# Patient Record
Sex: Female | Born: 1935 | Race: White | Hispanic: No | Marital: Married | Smoking: Never smoker
Health system: Southern US, Community
[De-identification: ages and names within clinical notes are randomized; demographics above are authoritative.]

## PROBLEM LIST (undated history)

## (undated) DIAGNOSIS — I2510 Atherosclerotic heart disease of native coronary artery without angina pectoris: Secondary | ICD-10-CM

## (undated) DIAGNOSIS — G4730 Sleep apnea, unspecified: Secondary | ICD-10-CM

## (undated) DIAGNOSIS — I10 Essential (primary) hypertension: Secondary | ICD-10-CM

## (undated) DIAGNOSIS — Q233 Congenital mitral insufficiency: Secondary | ICD-10-CM

## (undated) DIAGNOSIS — K219 Gastro-esophageal reflux disease without esophagitis: Secondary | ICD-10-CM

## (undated) DIAGNOSIS — I509 Heart failure, unspecified: Secondary | ICD-10-CM

## (undated) DIAGNOSIS — I639 Cerebral infarction, unspecified: Secondary | ICD-10-CM

## (undated) DIAGNOSIS — I4891 Unspecified atrial fibrillation: Secondary | ICD-10-CM

## (undated) DIAGNOSIS — J449 Chronic obstructive pulmonary disease, unspecified: Secondary | ICD-10-CM

## (undated) HISTORY — PX: NISSEN FUNDOPLICATION: SHX2091

## (undated) HISTORY — PX: ABDOMINAL HYSTERECTOMY: SHX81

## (undated) HISTORY — DX: Chronic obstructive pulmonary disease, unspecified: J44.9

## (undated) HISTORY — DX: Congenital mitral insufficiency: Q23.3

## (undated) HISTORY — DX: Sleep apnea, unspecified: G47.30

## (undated) HISTORY — PX: REPLACEMENT TOTAL KNEE: SUR1224

## (undated) HISTORY — DX: Cerebral infarction, unspecified: I63.9

## (undated) HISTORY — DX: Atherosclerotic heart disease of native coronary artery without angina pectoris: I25.10

---

## 2013-11-04 ENCOUNTER — Encounter (HOSPITAL_COMMUNITY): Payer: Self-pay | Admitting: Emergency Medicine

## 2013-11-04 ENCOUNTER — Emergency Department (HOSPITAL_COMMUNITY): Payer: Medicare Other

## 2013-11-04 ENCOUNTER — Emergency Department (HOSPITAL_COMMUNITY)
Admission: EM | Admit: 2013-11-04 | Discharge: 2013-11-04 | Disposition: A | Discharge status: Home / Self Care | Payer: Medicare Other | Attending: Emergency Medicine | Admitting: Emergency Medicine

## 2013-11-04 DIAGNOSIS — Z8719 Personal history of other diseases of the digestive system: Secondary | ICD-10-CM | POA: Insufficient documentation

## 2013-11-04 DIAGNOSIS — I509 Heart failure, unspecified: Secondary | ICD-10-CM | POA: Insufficient documentation

## 2013-11-04 DIAGNOSIS — S0990XA Unspecified injury of head, initial encounter: Secondary | ICD-10-CM | POA: Insufficient documentation

## 2013-11-04 DIAGNOSIS — W1809XA Striking against other object with subsequent fall, initial encounter: Secondary | ICD-10-CM | POA: Insufficient documentation

## 2013-11-04 DIAGNOSIS — Y939 Activity, unspecified: Secondary | ICD-10-CM | POA: Insufficient documentation

## 2013-11-04 DIAGNOSIS — IMO0002 Reserved for concepts with insufficient information to code with codable children: Secondary | ICD-10-CM

## 2013-11-04 DIAGNOSIS — Z880 Allergy status to penicillin: Secondary | ICD-10-CM | POA: Insufficient documentation

## 2013-11-04 DIAGNOSIS — I10 Essential (primary) hypertension: Secondary | ICD-10-CM | POA: Insufficient documentation

## 2013-11-04 DIAGNOSIS — S0180XA Unspecified open wound of other part of head, initial encounter: Secondary | ICD-10-CM | POA: Insufficient documentation

## 2013-11-04 DIAGNOSIS — Y92009 Unspecified place in unspecified non-institutional (private) residence as the place of occurrence of the external cause: Secondary | ICD-10-CM | POA: Insufficient documentation

## 2013-11-04 DIAGNOSIS — W19XXXA Unspecified fall, initial encounter: Secondary | ICD-10-CM

## 2013-11-04 HISTORY — DX: Essential (primary) hypertension: I10

## 2013-11-04 HISTORY — DX: Heart failure, unspecified: I50.9

## 2013-11-04 HISTORY — DX: Gastro-esophageal reflux disease without esophagitis: K21.9

## 2013-11-04 HISTORY — DX: Unspecified atrial fibrillation: I48.91

## 2013-11-04 LAB — PROTIME-INR
INR: 2.03 — ABNORMAL HIGH (ref 0.00–1.49)
Prothrombin Time: 22.3 seconds — ABNORMAL HIGH (ref 11.6–15.2)

## 2013-11-04 NOTE — ED Notes (Signed)
Called lab currently running PT INR.

## 2013-11-04 NOTE — ED Notes (Signed)
Patient transported to CT 

## 2013-11-04 NOTE — ED Notes (Signed)
[facility] EDPA at bedside to apply dermabond

## 2013-11-04 NOTE — ED Notes (Signed)
Pt reports being diagnosed with the flu Saturday

## 2013-11-04 NOTE — Discharge Instructions (Signed)
Fall Prevention and Home Safety Falls cause injuries and can affect all age groups. It is possible to prevent falls.  HOW TO PREVENT FALLS  Wear shoes with rubber soles that do not have an opening for your toes.  Keep the inside and outside of your house well lit.  Use night lights throughout your home.  Remove clutter from floors.  Clean up floor spills.  Remove throw rugs or fasten them to the floor with carpet tape.  Do not place electrical cords across pathways.  Put grab bars by your tub, shower, and toilet. Do not use towel bars as grab bars.  Put handrails on both sides of the stairway. Fix loose handrails.  Do not climb on stools or stepladders, if possible.  Do not wax your floors.  Repair uneven or unsafe sidewalks, walkways, or stairs.  Keep items you use a lot within reach.  Be aware of pets.  Keep emergency numbers next to the telephone.  Put smoke detectors in your home and near bedrooms. Ask your doctor what other things you can do to prevent falls. Document Released: 08/09/2009 Document Revised: 04/13/2012 Document Reviewed: 01/13/2012 [facility] Patient Information 2014 [place], [place].  Laceration Care, Adult A laceration is a cut that goes through all layers of the skin. The cut goes into the tissue beneath the skin. HOME CARE For stitches (sutures) or staples:  Keep the cut clean and dry.  If you have a bandage (dressing), change it at least once a day. Change the bandage if it gets wet or dirty, or as told by your doctor.  Wash the cut with soap and water 2 times a day. Rinse the cut with water. Pat it dry with a clean towel.  Put a thin layer of medicated cream on the cut as told by your doctor.  You may shower after the first 24 hours. Do not soak the cut in water until the stitches are removed.  Only take medicines as told by your doctor.  Have your stitches or staples removed as told by your doctor. For skin adhesive strips:  Keep the  cut clean and dry.  Do not get the strips wet. You may take a bath, but be careful to keep the cut dry.  If the cut gets wet, pat it dry with a clean towel.  The strips will fall off on their own. Do not remove the strips that are still stuck to the cut. For wound glue:  You may shower or take baths. Do not soak or scrub the cut. Do not swim. Avoid heavy sweating until the glue falls off on its own. After a shower or bath, pat the cut dry with a clean towel.  Do not put medicine on your cut until the glue falls off.  If you have a bandage, do not put tape over the glue.  Avoid lots of sunlight or tanning lamps until the glue falls off. Put sunscreen on the cut for the first year to reduce your scar.  The glue will fall off on its own. Do not pick at the glue. You may need a tetanus shot if:  You cannot remember when you had your last tetanus shot.  You have never had a tetanus shot. If you need a tetanus shot and you choose not to have one, you may get tetanus. Sickness from tetanus can be serious. GET HELP RIGHT AWAY IF:   Your pain does not get better with medicine.  Your arm, hand, leg,  or foot loses feeling (numbness) or changes color.  Your cut is bleeding.  Your joint feels weak, or you cannot use your joint.  You have painful lumps on your body.  Your cut is red, puffy (swollen), or painful.  You have a red line on the skin near the cut.  You have yellowish-white fluid (pus) coming from the cut.  You have a fever.  You have a bad smell coming from the cut or bandage.  Your cut breaks open before or after stitches are removed.  You notice something coming out of the cut, such as wood or glass.  You cannot move a finger or toe. MAKE SURE YOU:   Understand these instructions.  Will watch your condition.  Will get help right away if you are not doing well or get worse. Document Released: 03/31/2008 Document Revised: 01/05/2012 Document Reviewed:  04/08/2011 [facility] Patient Information 2014 [place], [place].

## 2013-11-04 NOTE — ED Notes (Signed)
PA-C at bedside 

## 2013-11-04 NOTE — ED Notes (Addendum)
Pt coming from home with c/o fall. Pt states she is on Coumadin. Pt presents with a 2 centimeter laceration above right eye, bleeding controlled. Pt denies LOC, n/v. Pt states she slipped out of her chair and her head struck the floor. Pt is A&Ox4, respirations equal and unlabored, skin warm and dry.

## 2013-11-04 NOTE — ED Notes (Signed)
Pt took herself off of oxygen [place] (2L); pt states that she only wears it at night; pt taken to the bathroom via stretcher; pt using bathroom at this time; family in room waiting

## 2013-11-04 NOTE — ED Notes (Signed)
Pt back in room at this time; family at bedside

## 2013-11-04 NOTE — ED Provider Notes (Signed)
CSN: [id number]     Arrival date & time 11/04/13  1478 History   First MD Initiated Contact with Patient 11/04/13 [phone]     Chief Complaint  Patient presents with  . Fall   (Consider location/radiation/quality/duration/timing/severity/associated sxs/prior Treatment) HPI Comments: Patient is an 80 year old female with history of congestive heart failure, atrial fibrillation, hypertension, influenza who presents today after a fall. She reports that she slept in a recliner last night, but that she was in the bed when she woke up. She fell out of the recliner, slid to the floor on her bottom and hit her head. She is only having a mild, burning pain above her right eye where she sustained a laceration. She is on Coumadin. Her last INR was on Tuesday and was 2.7. She has been ambulatory since the fall. She uses a walker to ambulate. She denies nausea, vomiting, abdominal pain. She has shortness of breath, but states nothing new. She denies any chest pain. Her last tetanus shot was in November.  Patient is a 80 y.o. female presenting with fall. The history is provided by the patient. No language interpreter was used.  Fall Pertinent negatives include no abdominal pain, chest pain, chills, fever, headaches, nausea or vomiting.    Past Medical History  Diagnosis Date  . CHF (congestive heart failure)   . A-fib   . Flu   . Hypertension   . GERD (gastroesophageal reflux disease)    Past Surgical History  Procedure Laterality Date  . Abdominal hysterectomy    . Replacement total knee     History reviewed. No pertinent family history. History  Substance Use Topics  . Smoking status: Never Smoker   . Smokeless tobacco: Never Used  . Alcohol Use: No   OB History   Grav Para Term Preterm Abortions TAB SAB Ect Mult Living                 Review of Systems  Constitutional: Negative for fever and chills.  Respiratory: Negative for shortness of breath.   Cardiovascular: Negative for chest pain.   Gastrointestinal: Negative for nausea, vomiting and abdominal pain.  Skin: Positive for wound.  Neurological: Negative for headaches.  All other systems reviewed and are negative.    Allergies  Penicillins  Home Medications  No current outpatient prescriptions on file. BP 142/82  Pulse 97  Temp(Src) 98.4 F (36.9 C) (Oral)  Resp 20  SpO2 95% Physical Exam  Nursing note and vitals reviewed. Constitutional: She is oriented to person, place, and time. She appears well-developed and well-nourished. No distress.  HENT:  Head: Normocephalic.  Right Ear: External ear normal.  Left Ear: External ear normal.  Nose: Nose normal.  Mouth/Throat: Oropharynx is clear and moist.  1.5 cm superficial laceration just superior to right eyebrow. No bleeding.   Eyes: Conjunctivae, EOM and lids are normal. Pupils are equal, round, and reactive to light.  Neck: Normal range of motion. No spinous process tenderness and no muscular tenderness present.  Cardiovascular: Normal rate, regular rhythm, normal heart sounds, intact distal pulses and normal pulses.   Pulmonary/Chest: Effort normal and breath sounds normal. No stridor. No respiratory distress. She has no wheezes. She has no rales.  Abdominal: Soft. She exhibits no distension. There is no tenderness.  Musculoskeletal: Normal range of motion.  Strength 5/5 in all extremities Pelvis stable  Neurological: She is alert and oriented to person, place, and time. She has normal strength. No sensory deficit. Coordination normal.  Grip strength  5/5  Skin: Skin is warm and dry. She is not diaphoretic. No erythema.  Psychiatric: She has a normal mood and affect. Her behavior is normal.    ED Course  Procedures (including critical care time) Labs Review Labs Reviewed  PROTIME-INR - Abnormal; Notable for the following:    Prothrombin Time 22.3 (*)    INR 2.03 (*)    All other components within normal limits   Imaging Review Ct Head Wo  Contrast  11/04/2013   CLINICAL DATA:  Status post fall with laceration above the right eye.  EXAM: CT HEAD WITHOUT CONTRAST  CT CERVICAL SPINE WITHOUT CONTRAST  TECHNIQUE: Multidetector CT imaging of the head and cervical spine was performed following the standard protocol without intravenous contrast. Multiplanar CT image reconstructions of the cervical spine were also generated.  COMPARISON:  None.  FINDINGS: CT HEAD FINDINGS  There is no midline shift, hydrocephalus or mass. No acute hemorrhage or acute transcortical infarct is identified. There is chronic diffuse atrophy. Chronic bilateral periventricular white matter small vessel ischemic change is noted. There are small infarctions, chronic in the bilateral sub insula. The bony calvarium is intact. The visualized sinuses are clear.  CT CERVICAL SPINE FINDINGS  There is no acute fracture or dislocation. The prevertebral soft tissues are normal. There are degenerative joint changes with narrowed joint space and osteophyte formation throughout the cervical spine. The lung apices are clear.  IMPRESSION: No focal acute intracranial abnormality identified. Chronic diffuse atrophy. Chronic bilateral periventricular white matter small vessel ischemic change.  No acute fracture or dislocation of cervical spine.   Electronically Signed   By: Sherian Rein M.D.   On: 11/04/2013 08:19   Ct Cervical Spine Wo Contrast  11/04/2013   CLINICAL DATA:  Status post fall with laceration above the right eye.  EXAM: CT HEAD WITHOUT CONTRAST  CT CERVICAL SPINE WITHOUT CONTRAST  TECHNIQUE: Multidetector CT imaging of the head and cervical spine was performed following the standard protocol without intravenous contrast. Multiplanar CT image reconstructions of the cervical spine were also generated.  COMPARISON:  None.  FINDINGS: CT HEAD FINDINGS  There is no midline shift, hydrocephalus or mass. No acute hemorrhage or acute transcortical infarct is identified. There is chronic  diffuse atrophy. Chronic bilateral periventricular white matter small vessel ischemic change is noted. There are small infarctions, chronic in the bilateral sub insula. The bony calvarium is intact. The visualized sinuses are clear.  CT CERVICAL SPINE FINDINGS  There is no acute fracture or dislocation. The prevertebral soft tissues are normal. There are degenerative joint changes with narrowed joint space and osteophyte formation throughout the cervical spine. The lung apices are clear.  IMPRESSION: No focal acute intracranial abnormality identified. Chronic diffuse atrophy. Chronic bilateral periventricular white matter small vessel ischemic change.  No acute fracture or dislocation of cervical spine.   Electronically Signed   By: Sherian Rein M.D.   On: 11/04/2013 08:19    EKG Interpretation   None      LACERATION REPAIR Performed by: Junious Silk Authorized by: Junious Silk Consent: Verbal consent obtained. Risks and benefits: risks, benefits and alternatives were discussed Consent given by: patient Patient identity confirmed: provided demographic data Prepped and Draped in normal sterile fashion Wound explored  Laceration Location: right forehead  Laceration Length: 1.5 cm  No Foreign Bodies seen or palpated  Anesthesia: local infiltration  Local anesthetic: none  Irrigation method: syringe Amount of cleaning: standard  Skin closure: dermabond  Patient tolerance: Patient tolerated  the procedure well with no immediate complications.   MDM   1. Fall, initial encounter   2. Laceration    Patient is an 80 year old female who presents today after a mechanical fall. Patient is alert and oriented. She is able to give a clear, coherent history. Pt is on Coumadin. INR is 2.03. Head and cervical spine CT are negative for acute pathology. I discussed this case with Dr. Rubin PayorPickering who agrees with plan. Will call cardiologist on Monday for follow up. Return instructions given.  Vital signs stable for discharge. Patient / Family / Caregiver informed of clinical course, understand medical decision-making process, and agree with plan.     Mora BellmanHannah S Nancyann Cotterman, PA-C 11/04/13 1215  Mora BellmanHannah S Rorie Delmore, PA-C 11/04/13 1215

## 2013-11-06 NOTE — ED Provider Notes (Signed)
Medical screening examination/treatment/procedure(s) were performed by non-physician practitioner and as supervising physician I was immediately available for consultation/collaboration.  EKG Interpretation   None        Juliet RudeNathan R. Rubin PayorPickering, MD 11/06/13 [phone]

## 2014-11-14 LAB — PROTIME-INR: INR: 2.9 — AB (ref 0.9–1.1)

## 2014-11-15 LAB — PROTIME-INR: INR: 1.7 — AB (ref 0.9–1.1)

## 2014-12-26 ENCOUNTER — Encounter: Payer: Self-pay | Admitting: Cardiology

## 2014-12-26 ENCOUNTER — Ambulatory Visit (INDEPENDENT_AMBULATORY_CARE_PROVIDER_SITE_OTHER): Payer: Medicare Other | Admitting: Cardiology

## 2014-12-26 VITALS — BP 116/60 | HR 87 | Ht 66.0 in | Wt 188.0 lb

## 2014-12-26 DIAGNOSIS — I2510 Atherosclerotic heart disease of native coronary artery without angina pectoris: Secondary | ICD-10-CM | POA: Insufficient documentation

## 2014-12-26 DIAGNOSIS — I482 Chronic atrial fibrillation, unspecified: Secondary | ICD-10-CM

## 2014-12-26 DIAGNOSIS — E878 Other disorders of electrolyte and fluid balance, not elsewhere classified: Secondary | ICD-10-CM

## 2014-12-26 DIAGNOSIS — I4891 Unspecified atrial fibrillation: Secondary | ICD-10-CM | POA: Insufficient documentation

## 2014-12-26 NOTE — Progress Notes (Signed)
Cardiology Office Note   Date:  12/26/2014   ID:  Sally PootMyrtle White, DOB 02/12/1928, MRN [id number]  PCP:  PROVIDER NOT IN SYSTEM  Cardiologist:   Rollene RotundaJames Domenic Schoenberger, MD   Chief Complaint  Patient presents with  . Congestive Heart Failure      History of Present Illness: Sally White is a 81 y.o. female who presents for new patient evaluation. [place] from [place]. She has a history of coronary disease with stenting in 2005 in her circumflex. 2007 demonstrated this to be patent. She has had a preserved ejection fraction. Her last stress test was in December 2014 with a small fixed defects at the apex. Her last echocardiogram that I have currently was in January of last year with a well preserved ejection fraction. However, she says [facility] and had [facility]. I don't have this result. She actually says she's been hospitalized 4 times this year. [facility] early [facility]. Later on that month she had a nosebleed. The end of the month she was hospitalized with apparent diastolic heart failure. In [place] later on she had a fall. She was seen again in the emergency room for volume overload last weekend. She denies any chest pain. She's not had any neck or arm discomfort. She denies any palpitations, presyncope or syncope. She seems to sleep chronically with her head elevated in a hospital bed. She is weighing herself daily and her weight seemed to be stable. She is not particularly restricting salt. She is limited by joint pain and gets around with a walker. She has had some mild extremity swelling.  Past Medical History  Diagnosis Date  . CHF (congestive heart failure)   . A-fib   . Hypertension   . GERD (gastroesophageal reflux disease)   . COPD (chronic obstructive pulmonary disease)     O2 at night  . Sleep apnea     Past Surgical History  Procedure Laterality Date  . Abdominal  hysterectomy    . Replacement total knee    . Nissen fundoplication       Current Outpatient Prescriptions  Medication Sig Dispense Refill  . albuterol-ipratropium (COMBIVENT) 18-103 MCG/ACT inhaler Inhale 2 puffs into the lungs every 6 (six) hours as needed for wheezing or shortness of breath.    . benzonatate (TESSALON) 100 MG capsule Take 100 mg by mouth 3 (three) times daily as needed for cough.    . chlorthalidone (HYGROTON) 25 MG tablet Take 12.5 mg by mouth daily.    . furosemide (LASIX) 40 MG tablet Take 20-40 mg by mouth daily.    Marland Kitchen. losartan (COZAAR) 50 MG tablet Take 50 mg by mouth daily.    . pravastatin (PRAVACHOL) 10 MG tablet Take 10 mg by mouth daily.    . ranitidine (ZANTAC) 150 MG tablet Take 150 mg by mouth 2 (two) times daily.    Marland Kitchen. warfarin (COUMADIN) 5 MG tablet Take 2.5 mg by mouth daily at 6 PM.     No current facility-administered medications for this visit.    Allergies:   Penicillins    Social History:  The patient  reports that she has never smoked. She has never used smokeless tobacco. She reports that she does not drink alcohol or use illicit drugs.   Family History:  The patient's family history includes Cancer - Colon in her sister; Heart disease in her mother; Liver cancer in her  father; Lung cancer in her brother.    ROS:  Please see the history of present illness.   Otherwise, review of systems are positive for none.   All other systems are reviewed and negative.    PHYSICAL EXAM: VS:  BP 116/60 mmHg  Pulse 87  Ht  (1.676 m)  Wt 188 lb (85.276 kg)  BMI 30.36 kg/m2 , BMI Body mass index is 30.36 kg/(m^2). GENERAL:  Well appearing HEENT:  Pupils equal round and reactive, fundi not visualized, oral mucosa unremarkable NECK:  Positive jugular venous distention at 45 degrees, waveform within normal limits, carotid upstroke brisk and symmetric, no bruits, no thyromegaly LYMPHATICS:  No cervical, inguinal adenopathy LUNGS:  Bilateral basilar  crackles BACK:  No CVA tenderness CHEST:  Unremarkable HEART:  PMI not displaced or sustained,S1 and S2 within normal limits, no S3, no S4, no clicks, no rubs, no murmurs ABD:  Flat, positive bowel sounds normal in frequency in pitch, no bruits, no rebound, no guarding, no midline pulsatile mass, no hepatomegaly, no splenomegaly EXT:  2 plus pulses throughout, mild edema, no cyanosis no clubbing SKIN:  No rashes no nodules NEURO:  Cranial nerves II through XII grossly intact, motor grossly intact throughout PSYCH:  Cognitively intact, oriented to person place and time    EKG:  EKG is ordered today. The ekg ordered today demonstrates atrial fibrillation, rate 87, interventricular conduction delay   Recent Labs: No results found for requested labs within last 365 days.      Wt Readings from Last 3 Encounters:  12/26/14 188 lb (85.276 kg)      Other studies Reviewed: Additional studies/ records that were reviewed today include: Outside records. Review of the above records demonstrates:  Please see elsewhere in the note.    ASSESSMENT AND PLAN:  CHF:  It sounds like she has chronic diastolic dysfunction and I think she's having some acute exacerbation. I had a long discussion with the patient and her daughter about this. We talked about salt restriction, fluid restriction, daily weights and when necessary dosing of Lasix. For now she is going to increase her Lasix to 40 mg twice a day. She's going get a basic metabolic profile today. We'll going to have her establish with the clinic in [place] since she lives up there.  I would like for her to be seen Monday.  ATRIAL FIB:  She will establish with our warfarin clinic. She tolerates rate control and anticoagulation.  CAD:  I do not suspect ischemia. We should review the records from [place] when available. At this point I was not planning another ischemia evaluation.  MR:   Follow up echo results from [facility].     Current medicines are  reviewed at length with the patient today.  The patient has concerns regarding medicines.  The following changes have been made:  See elsewhere  Labs/ tests ordered today include:   Orders Placed This Encounter  Procedures  . Basic metabolic panel  . EKG 12-Lead     Disposition:   FU in [place] as above.     Signed, Rollene Rotunda, MD  12/26/2014 5:54 PM    [facility]

## 2014-12-26 NOTE — Patient Instructions (Signed)
Your physician recommends that you schedule a follow-up appointment in: on Monday in [place] 01/01/2015 with who ever is there  Take furosemide 40 mg two times a day for two days then back to your reg dose  Pt. [facility]

## 2014-12-27 ENCOUNTER — Telehealth: Payer: Self-pay | Admitting: Cardiovascular Disease

## 2014-12-27 ENCOUNTER — Telehealth: Payer: Self-pay | Admitting: Cardiology

## 2014-12-27 NOTE — Telephone Encounter (Signed)
Mosetta Anisasheika is calling because she is needing a signature on orders for lab on Sally White .

## 2014-12-27 NOTE — Telephone Encounter (Signed)
[facility] - needs Dr. Jenene SlickerHochrein's signature. Gave fax number to send to us.

## 2014-12-27 NOTE — Telephone Encounter (Signed)
Form signed & faxed back, confirmed receipt w/ requestor.

## 2014-12-27 NOTE — Telephone Encounter (Signed)
Daughter called stating that Sally White is having episodes of coughing and shortness of breath. Please call # [phone].

## 2014-12-27 NOTE — Telephone Encounter (Signed)
Spoke with daughter and she stated that patient's cough and breathing had improved since she called this morning. Patient denied chest pain, or dizziness. Nurse advised patient that [facility] and if patient's sob and cough came back, to call our office again. Daughter verbalized understanding of plan.

## 2015-01-01 ENCOUNTER — Ambulatory Visit: Payer: Medicare Other | Admitting: Cardiovascular Disease

## 2015-01-02 ENCOUNTER — Telehealth: Payer: Self-pay | Admitting: Cardiology

## 2015-01-02 NOTE — Telephone Encounter (Signed)
Please call,pt says she is having problems. Pt did not give details of her problems.

## 2015-01-02 NOTE — Telephone Encounter (Signed)
I spoke with daughter (on dpr).  She reports that her mother is visiting from [place] and has fallen x2 since Friday.  The symptoms started abruptly on Friday and worsened as the days have gone by.  The patient reports that her feet won't move and there is some pain involved.  She is exhibiting a decrease in coordination as well.  Patient does not exhibit unilateral symptoms or decrease in mental function.  Patient does not have a PCP.  The daughter seems very concerned and is questioning where to take her mom.  I advised to go to the ER for evaluation.  I do not think that this is heart related, but definitely needs to be addressed!  Daughter verbalized understanding.

## 2015-01-08 ENCOUNTER — Telehealth: Payer: Self-pay | Admitting: Cardiology

## 2015-01-08 NOTE — Telephone Encounter (Signed)
Message was not needed at this time.

## 2015-01-09 ENCOUNTER — Encounter: Payer: Self-pay | Admitting: Cardiovascular Disease

## 2015-01-09 ENCOUNTER — Ambulatory Visit (INDEPENDENT_AMBULATORY_CARE_PROVIDER_SITE_OTHER): Payer: Medicare Other | Admitting: Cardiovascular Disease

## 2015-01-09 ENCOUNTER — Ambulatory Visit (INDEPENDENT_AMBULATORY_CARE_PROVIDER_SITE_OTHER): Payer: Medicare Other | Admitting: *Deleted

## 2015-01-09 VITALS — BP 118/78 | HR 77 | Ht 66.0 in | Wt 175.2 lb

## 2015-01-09 DIAGNOSIS — Z5181 Encounter for therapeutic drug level monitoring: Secondary | ICD-10-CM

## 2015-01-09 DIAGNOSIS — I482 Chronic atrial fibrillation, unspecified: Secondary | ICD-10-CM

## 2015-01-09 DIAGNOSIS — W19XXXS Unspecified fall, sequela: Secondary | ICD-10-CM

## 2015-01-09 DIAGNOSIS — I447 Left bundle-branch block, unspecified: Secondary | ICD-10-CM

## 2015-01-09 DIAGNOSIS — E785 Hyperlipidemia, unspecified: Secondary | ICD-10-CM

## 2015-01-09 DIAGNOSIS — I38 Endocarditis, valve unspecified: Secondary | ICD-10-CM

## 2015-01-09 DIAGNOSIS — Z7901 Long term (current) use of anticoagulants: Secondary | ICD-10-CM

## 2015-01-09 DIAGNOSIS — I5033 Acute on chronic diastolic (congestive) heart failure: Secondary | ICD-10-CM

## 2015-01-09 DIAGNOSIS — I480 Paroxysmal atrial fibrillation: Secondary | ICD-10-CM

## 2015-01-09 DIAGNOSIS — I2510 Atherosclerotic heart disease of native coronary artery without angina pectoris: Secondary | ICD-10-CM

## 2015-01-09 DIAGNOSIS — Z9289 Personal history of other medical treatment: Secondary | ICD-10-CM

## 2015-01-09 DIAGNOSIS — Z87898 Personal history of other specified conditions: Secondary | ICD-10-CM

## 2015-01-09 LAB — POCT INR: INR: 3.9

## 2015-01-09 MED ORDER — METOPROLOL SUCCINATE ER 25 MG PO TB24: 25.0000 mg | ORAL_TABLET | Freq: Every day | ORAL | Status: AC

## 2015-01-09 MED ORDER — FUROSEMIDE 40 MG PO TABS
40.0000 mg | ORAL_TABLET | Freq: Every day | ORAL | Status: DC
Start: 2015-01-09 — End: 2015-02-01

## 2015-01-09 MED ORDER — PRAVASTATIN SODIUM 10 MG PO TABS: 10.0000 mg | ORAL_TABLET | Freq: Every day | ORAL | Status: AC

## 2015-01-09 MED ORDER — POTASSIUM CHLORIDE ER 10 MEQ PO TBCR: EXTENDED_RELEASE_TABLET | ORAL | Status: AC

## 2015-01-09 MED ORDER — RANITIDINE HCL 150 MG PO TABS: 150.0000 mg | ORAL_TABLET | Freq: Two times a day (BID) | ORAL | Status: AC

## 2015-01-09 MED ORDER — BENZONATATE 100 MG PO CAPS: 100.0000 mg | ORAL_CAPSULE | Freq: Three times a day (TID) | ORAL | Status: AC | PRN

## 2015-01-09 MED ORDER — SPIRONOLACTONE 25 MG PO TABS
12.5000 mg | ORAL_TABLET | Freq: Every day | ORAL | Status: AC
Start: 2015-01-09 — End: ?

## 2015-01-09 NOTE — Patient Instructions (Addendum)
Your physician recommends that you schedule a follow-up appointment in: [place]   Take Lasix and potassium as we discussed,call if you use extra Lasix for more than 3 consecutive days.     You have been referred to PT     Please get BMET this Friday, 01/12/15  Thank you for choosing [facility] !

## 2015-01-09 NOTE — Progress Notes (Signed)
Patient ID: Sally PootMyrtle White, female   DOB: 05/31/1928, 81 y.o.   MRN: [id number]      SUBJECTIVE: The patient presents for post hospitalization follow-up. This is my first time meeting her. She saw Dr. Antoine White as a new patient on 12/26/14. She moved here from [place], [place], to be with her daughter, Sally White, who has assumed primary care for her this year. [place] live in [place], [place].  She has a history of coronary disease with stenting in 2005 in her circumflex. 2007 demonstrated this to be patent.  Most recent echocardiogram was performed on 02/16/14 which demonstrated normal left ventricular systolic function 55-60%, moderate mitral, tricuspid, and pulmonic regurgitation, mild left atrial enlargement, and mild right ventricular enlargement. The study was technically inadequate to assess diastolic function due to atrial fibrillation. She had a normal nuclear stress test in [place], [place], on 01/10/13.  ECG performed on 12/26/14 demonstrated atrial fibrillation, heart rate 87 bpm, with a nonspecific interventricular conduction delay, QRS duration 138 ms. She was reportedly hospitalized at [facility] multiple times with congestive heart failure.  She uses a walker to ambulate and weighs herself daily. She was recently hospitalized and discharged on 01/02/15 [facility]. Discharge diagnoses included a fall with intractable right hip pain with no acute fracture or dislocation. I personally reviewed all documentation and studies from this hospitalization. Her heart rate was reportedly controlled and INR was therapeutic at 2.9. She had no evidence of congestive heart failure. Pertinent labs include hemoglobin 11.6, platelets 384, sodium 139, potassium 4.3, BUN 22, creatinine 1.07.  ECG performed on 01/01/15 demonstrated atrial fibrillation, heart rate 79 bpm, with a left bundle branch block.  With respect to her fall, it appears that she lost her balance. She has not  received any physical therapy yet. She typically takes Lasix 40 mg daily and this dose has to be increased to 40 mg twice daily roughly every 16 days as per her daughter. The patient denies chest pain and feels that her baseline exertional dyspnea is stable. She denies palpitations. She has been struggling with dizziness and vertigo recently. She denies leg swelling. She appears to consume a lot of sodium as per her daughter. Her daughter has noticed that in the past 24 hours her mother is slightly more congested and short of breath and has been coughing more, and plans to increase Lasix to 40 mg twice daily for the next 2-3 days.  She just established primary care with Dr. Sherril [place] in [place].  Review of Systems: As per "subjective", otherwise negative.  Allergies  Allergen Reactions  . Penicillins Itching and Swelling    Current Outpatient Prescriptions  Medication Sig Dispense Refill  . benzonatate (TESSALON) 100 MG capsule Take 100 mg by mouth 3 (three) times daily as needed for cough.    . cholecalciferol (VITAMIN D) 1000 UNITS tablet Take 1,000 Units by mouth daily.    . furosemide (LASIX) 40 MG tablet Take 20-40 mg by mouth daily.    . Magnesium Hydroxide (MILK OF MAGNESIA PO) Take by mouth.    . metoprolol succinate (TOPROL-XL) 25 MG 24 hr tablet Take 25 mg by mouth daily.    . potassium chloride (K-DUR) 10 MEQ tablet Take 10 mEq by mouth daily.    . pravastatin (PRAVACHOL) 10 MG tablet Take 10 mg by mouth daily.    . ranitidine (ZANTAC) 150 MG tablet Take 150 mg by mouth 2 (two) times daily.    Marland Kitchen. spironolactone (ALDACTONE) 25 MG tablet  Take 25 mg by mouth daily.    Marland Kitchen warfarin (COUMADIN) 5 MG tablet Take 3 mg by mouth daily at 6 PM.      No current facility-administered medications for this visit.    Past Medical History  Diagnosis Date  . CHF (congestive heart failure)     Preserved EF  . A-fib   . Hypertension   . GERD (gastroesophageal reflux disease)   . COPD (chronic  obstructive pulmonary disease)     O2 at night  . Sleep apnea   . CAD (coronary artery disease)     Stent to circ 2005.    . MR (congenital mitral regurgitation)     Moderate    Past Surgical History  Procedure Laterality Date  . Abdominal hysterectomy    . Replacement total knee    . Nissen fundoplication      History   Social History  . Marital Status: Married    Spouse Name: N/A  . Number of Children: 3  . Years of Education: N/A   Occupational History  . Not on file.   Social History Main Topics  . Smoking status: Never Smoker   . Smokeless tobacco: Never Used  . Alcohol Use: No  . Drug Use: No  . Sexual Activity: No   Other Topics Concern  . Not on file   Social History Narrative   Lives with daughter.       Filed Vitals:   01/09/15 0903  BP: 118/78  Pulse: 77  Height:  (1.676 m)  Weight: 175 lb 3.2 oz (79.47 kg)  SpO2: 99%    PHYSICAL EXAM General: NAD, elderly, sitting in wheelchair. HEENT: Normal. Neck: No JVD, no thyromegaly. Lungs: Bibasilar rales up to 1/4 up with normal respiratory effort. CV: Nondisplaced PMI.  Irregular rhythm, normal rate, normal S1/S2, no S3, no murmur. No pretibial or periankle edema.    Abdomen: Soft, nontender, no distention.  Neurologic: Alert and oriented x 3.  Psych: Normal affect. Skin: Normal. Musculoskeletal: No gross deformities. Extremities: No clubbing or cyanosis.   ECG: Most recent ECG reviewed.      ASSESSMENT AND PLAN: 1. CAD: Stable ischemic heart disease. On metoprolol succinate 25 mg daily and pravastatin 10 mg daily. No ASA as no warfarin. 2. Acute on chronic diastolic heart failure: Appears to have some mild decompensation. Given her daughter's close involvement with her care, I feel it is permissible to increase her Lasix to 40 mg twice daily for 2 or 3 days until her mother's congestion resolves and then resume 40 mg daily. She has been instructed to take 20 mEq of supplemental  potassium chloride on the days her Lasix is increased. I had a lengthy discussion with her about the importance of a low sodium diet. She had been weighed daily at home until her fall and now this has become difficult for her daughter. Will obtain a BMET on 3/18. Will give 90-day supplies of all meds with refills. 3. Atrial fibrillation: I will enroll her in our anticoagulation clinic for INR monitoring. HR controlled on Toprol-XL 25 mg daily. INR supratherapeutic at 3.8 today. Will have her meet with our anticoagulation nurse today for warfarin adjustment. 4. Valvular heart disease: Most recent echo results noted above. Symptomatically stable. Will monitor clinically. 5. Recent fall with hip injury: Will order home physical therapy.  Dispo: f/u 6 weeks in [place] office.  Time spent: 40 minutes, of which greater than 50% was spent reviewing symptoms, relevant blood  tests and studies, and discussing management plan with the patient.  Kate Sable, M.D., F.A.C.C.

## 2015-01-10 ENCOUNTER — Telehealth: Payer: Self-pay | Admitting: Cardiovascular Disease

## 2015-01-10 NOTE — Telephone Encounter (Signed)
Would like to speak with nurse regarding medications / tg

## 2015-01-16 ENCOUNTER — Ambulatory Visit (INDEPENDENT_AMBULATORY_CARE_PROVIDER_SITE_OTHER): Payer: Medicare Other | Admitting: *Deleted

## 2015-01-16 DIAGNOSIS — I480 Paroxysmal atrial fibrillation: Secondary | ICD-10-CM | POA: Diagnosis not present

## 2015-01-16 DIAGNOSIS — Z5181 Encounter for therapeutic drug level monitoring: Secondary | ICD-10-CM | POA: Diagnosis not present

## 2015-01-16 LAB — POCT INR: INR: 2.7

## 2015-01-17 ENCOUNTER — Telehealth: Payer: Self-pay | Admitting: *Deleted

## 2015-01-17 NOTE — Telephone Encounter (Signed)
Patient already has follow up scheduled for 02/20/15 here in [place] office.

## 2015-01-17 NOTE — Telephone Encounter (Signed)
Notes Recorded by Lesle ChrisAngela G Toretto Tingler, LPN on 1/61/09603/23/2016 at 2:24 PM Daughter Haskell Flirt(Dorenda) notified.

## 2015-01-17 NOTE — Telephone Encounter (Signed)
-----   Message from Laqueta LindenSuresh A Koneswaran, MD sent at 01/17/2015  1:40 PM EDT ----- Stable.

## 2015-01-23 ENCOUNTER — Ambulatory Visit: Payer: Medicare Other | Admitting: Cardiovascular Disease

## 2015-01-24 ENCOUNTER — Ambulatory Visit (INDEPENDENT_AMBULATORY_CARE_PROVIDER_SITE_OTHER): Payer: Medicare Other | Admitting: *Deleted

## 2015-01-24 DIAGNOSIS — I480 Paroxysmal atrial fibrillation: Secondary | ICD-10-CM

## 2015-01-24 DIAGNOSIS — Z5181 Encounter for therapeutic drug level monitoring: Secondary | ICD-10-CM | POA: Diagnosis not present

## 2015-01-24 LAB — POCT INR: INR: 2.5

## 2015-01-26 ENCOUNTER — Ambulatory Visit (INDEPENDENT_AMBULATORY_CARE_PROVIDER_SITE_OTHER): Payer: Medicare Other | Admitting: Neurology

## 2015-01-26 ENCOUNTER — Emergency Department (HOSPITAL_COMMUNITY): Payer: Medicare Other

## 2015-01-26 ENCOUNTER — Encounter: Payer: Self-pay | Admitting: Neurology

## 2015-01-26 ENCOUNTER — Encounter (HOSPITAL_COMMUNITY): Payer: Self-pay

## 2015-01-26 ENCOUNTER — Other Ambulatory Visit: Payer: Self-pay

## 2015-01-26 ENCOUNTER — Emergency Department (HOSPITAL_COMMUNITY)
Admission: EM | Admit: 2015-01-26 | Discharge: 2015-01-26 | Disposition: A | Discharge status: Home / Self Care | Payer: Medicare Other | Attending: Emergency Medicine | Admitting: Emergency Medicine

## 2015-01-26 VITALS — BP 117/77 | HR 77 | Resp 24 | Ht 66.0 in | Wt 177.0 lb

## 2015-01-26 DIAGNOSIS — Y9389 Activity, other specified: Secondary | ICD-10-CM | POA: Insufficient documentation

## 2015-01-26 DIAGNOSIS — Z79899 Other long term (current) drug therapy: Secondary | ICD-10-CM | POA: Diagnosis not present

## 2015-01-26 DIAGNOSIS — Y9289 Other specified places as the place of occurrence of the external cause: Secondary | ICD-10-CM | POA: Insufficient documentation

## 2015-01-26 DIAGNOSIS — R262 Difficulty in walking, not elsewhere classified: Secondary | ICD-10-CM | POA: Insufficient documentation

## 2015-01-26 DIAGNOSIS — M6281 Muscle weakness (generalized): Secondary | ICD-10-CM | POA: Diagnosis not present

## 2015-01-26 DIAGNOSIS — Z8673 Personal history of transient ischemic attack (TIA), and cerebral infarction without residual deficits: Secondary | ICD-10-CM | POA: Diagnosis not present

## 2015-01-26 DIAGNOSIS — I6340 Cerebral infarction due to embolism of unspecified cerebral artery: Secondary | ICD-10-CM

## 2015-01-26 DIAGNOSIS — I2510 Atherosclerotic heart disease of native coronary artery without angina pectoris: Secondary | ICD-10-CM | POA: Diagnosis not present

## 2015-01-26 DIAGNOSIS — H5509 Other forms of nystagmus: Secondary | ICD-10-CM | POA: Insufficient documentation

## 2015-01-26 DIAGNOSIS — I509 Heart failure, unspecified: Secondary | ICD-10-CM | POA: Diagnosis not present

## 2015-01-26 DIAGNOSIS — I4891 Unspecified atrial fibrillation: Secondary | ICD-10-CM | POA: Diagnosis not present

## 2015-01-26 DIAGNOSIS — Z7901 Long term (current) use of anticoagulants: Secondary | ICD-10-CM | POA: Diagnosis not present

## 2015-01-26 DIAGNOSIS — R269 Unspecified abnormalities of gait and mobility: Secondary | ICD-10-CM | POA: Insufficient documentation

## 2015-01-26 DIAGNOSIS — Z043 Encounter for examination and observation following other accident: Secondary | ICD-10-CM | POA: Insufficient documentation

## 2015-01-26 DIAGNOSIS — W19XXXA Unspecified fall, initial encounter: Secondary | ICD-10-CM

## 2015-01-26 DIAGNOSIS — Z9861 Coronary angioplasty status: Secondary | ICD-10-CM | POA: Insufficient documentation

## 2015-01-26 DIAGNOSIS — Y998 Other external cause status: Secondary | ICD-10-CM | POA: Diagnosis not present

## 2015-01-26 DIAGNOSIS — R531 Weakness: Secondary | ICD-10-CM

## 2015-01-26 DIAGNOSIS — W1839XA Other fall on same level, initial encounter: Secondary | ICD-10-CM | POA: Insufficient documentation

## 2015-01-26 DIAGNOSIS — Q233 Congenital mitral insufficiency: Secondary | ICD-10-CM | POA: Diagnosis not present

## 2015-01-26 DIAGNOSIS — Z9981 Dependence on supplemental oxygen: Secondary | ICD-10-CM | POA: Insufficient documentation

## 2015-01-26 DIAGNOSIS — I749 Embolism and thrombosis of unspecified artery: Secondary | ICD-10-CM

## 2015-01-26 DIAGNOSIS — IMO0002 Reserved for concepts with insufficient information to code with codable children: Secondary | ICD-10-CM | POA: Insufficient documentation

## 2015-01-26 DIAGNOSIS — Z880 Allergy status to penicillin: Secondary | ICD-10-CM | POA: Insufficient documentation

## 2015-01-26 DIAGNOSIS — J449 Chronic obstructive pulmonary disease, unspecified: Secondary | ICD-10-CM | POA: Diagnosis not present

## 2015-01-26 DIAGNOSIS — G4730 Sleep apnea, unspecified: Secondary | ICD-10-CM | POA: Diagnosis not present

## 2015-01-26 DIAGNOSIS — I10 Essential (primary) hypertension: Secondary | ICD-10-CM | POA: Diagnosis not present

## 2015-01-26 DIAGNOSIS — K219 Gastro-esophageal reflux disease without esophagitis: Secondary | ICD-10-CM | POA: Diagnosis not present

## 2015-01-26 DIAGNOSIS — H8190 Unspecified disorder of vestibular function, unspecified ear: Secondary | ICD-10-CM

## 2015-01-26 DIAGNOSIS — R2689 Other abnormalities of gait and mobility: Secondary | ICD-10-CM

## 2015-01-26 LAB — URINALYSIS, ROUTINE W REFLEX MICROSCOPIC
Bilirubin Urine: NEGATIVE
Glucose, UA: NEGATIVE mg/dL
HGB URINE DIPSTICK: NEGATIVE
Ketones, ur: NEGATIVE mg/dL
LEUKOCYTES UA: NEGATIVE
Nitrite: NEGATIVE
Protein, ur: NEGATIVE mg/dL
Specific Gravity, Urine: 1.021 (ref 1.005–1.030)
Urobilinogen, UA: 1 mg/dL (ref 0.0–1.0)
pH: 6.5 (ref 5.0–8.0)

## 2015-01-26 LAB — CBC
HCT: 43.9 % (ref 36.0–46.0)
Hemoglobin: 13.8 g/dL (ref 12.0–15.0)
MCH: 26.2 pg (ref 26.0–34.0)
MCHC: 31.4 g/dL (ref 30.0–36.0)
MCV: 83.5 fL (ref 78.0–100.0)
Platelets: 252 10*3/uL (ref 150–400)
RBC: 5.26 MIL/uL — AB (ref 3.87–5.11)
RDW: 17 % — ABNORMAL HIGH (ref 11.5–15.5)
WBC: 8.1 10*3/uL (ref 4.0–10.5)

## 2015-01-26 LAB — I-STAT TROPONIN, ED: Troponin i, poc: 0 ng/mL (ref 0.00–0.08)

## 2015-01-26 LAB — BASIC METABOLIC PANEL
Anion gap: 9 (ref 5–15)
BUN: 20 mg/dL (ref 6–23)
CALCIUM: 9.8 mg/dL (ref 8.4–10.5)
CHLORIDE: 108 mmol/L (ref 96–112)
CO2: 24 mmol/L (ref 19–32)
Creatinine, Ser: 1.19 mg/dL — ABNORMAL HIGH (ref 0.50–1.10)
GFR, EST AFRICAN AMERICAN: 47 mL/min — AB (ref >90)
GFR, EST NON AFRICAN AMERICAN: 41 mL/min — AB (ref >90)
GLUCOSE: 97 mg/dL (ref 70–99)
POTASSIUM: 4.9 mmol/L (ref 3.5–5.1)
SODIUM: 141 mmol/L (ref 135–145)

## 2015-01-26 NOTE — ED Notes (Signed)
Pt has been unable to walk since second fall per daughter, states that her balance is off since fall. -per RN Amil AmenJulia

## 2015-01-26 NOTE — Patient Instructions (Signed)
Atrial Fibrillation  Atrial fibrillation is a condition that causes your heart to beat irregularly. It may also cause your heart to beat faster than normal. Atrial fibrillation can prevent your heart from pumping blood normally. It increases your risk of stroke and heart problems.  HOME CARE  · Take medications as told by your doctor.  · Only take medications that your doctor says are safe. Some medications can make the condition worse or happen again.  · If blood thinners were prescribed by your doctor, take them exactly as told. Too much can cause bleeding. Too little and you will not have the needed protection against stroke and other problems.  · Perform blood tests at home if told by your doctor.  · Perform blood tests exactly as told by your doctor.  · Do not drink alcohol.  · Do not drink beverages with caffeine such as coffee, soda, and some teas.  · Maintain a healthy weight.  · Do not use diet pills unless your doctor says they are safe. They may make heart problems worse.  · Follow diet instructions as told by your doctor.  · Exercise regularly as told by your doctor.  · Keep all follow-up appointments.  GET HELP IF:  · You notice a change in the speed, rhythm, or strength of your heartbeat.  · You suddenly begin peeing (urinating) more often.  · You get tired more easily when moving or exercising.  GET HELP RIGHT AWAY IF:   · You have chest or belly (abdominal) pain.  · You feel sick to your stomach (nauseous).  · You are short of breath.  · You suddenly have swollen feet and ankles.  · You feel dizzy.  · You face, arms, or legs feel numb or weak.  · There is a change in your vision or speech.  MAKE SURE YOU:   · Understand these instructions.  · Will watch your condition.  · Will get help right away if you are not doing well or get worse.  Document Released: 07/22/2008 Document Revised: 02/27/2014 Document Reviewed: 11/23/2012  ExitCare® Patient Information ©2015 [employer]. This information is not  intended to replace advice given to you by your health care provider. Make sure you discuss any questions you have with your health care provider.

## 2015-01-26 NOTE — ED Notes (Signed)
Daughter reports pt fell on 2/16: fell onto L knee and then onto face with nose fracture reported; on 3/4, pt fell again onto R hip-c/o R leg pain with xrays negative for R hip/leg.

## 2015-01-26 NOTE — Progress Notes (Signed)
Provider:  Melvyn Novas, M D  Referring Provider: No ref. provider found Primary Care Physician:  Ignatius Specking., MD  Chief Complaint  Patient presents with  . NP [facility]  ? Stroke    Rm 10, Daughter and son in law    HPI:  Sally White is a 81 y.o. female  is seen here as an unannounced  referral  from Dr. Andrey Campanile , ENT in [place],  Patient's history relevant for today is a pre-existing condition of atrial fibrillation and chronic anticoagulation. On February 16 the patient apparently fell in her private home [place] and broke her nose. On March 4th she has had another fall, complaining this time of  Dizziness, since than ongoing  difficulties with balance and was unable to walk since.  Neurologically of increased interest is that the patient has developed a different speech pattern. She appears to speak very slowly her voice is soft and of lower volumes than usual. Her daughter describes that she has replaced words so she used for example the word 'sponge "  and later  "blanket " to describe the  Flag. The patient is aware that she uses the wrong terms  but she has obviously difficulty to find the correct word. She leans to the left, she is unable to move her feet, she reports.  The patient did not see a PCP before ENT, and was directly brought by family to our office this morning.   The patient's daughter has noticed that her mother is no longer able to repeat also she used to be able to read without any corrective lens. She has no history for cataract. I was unable to identify a macular in her ophthalmoscopic  evaluation.  Review of Systems: Out of a complete 14 system review, the patient complains of only the following symptoms, and all other reviewed systems are negative. See above ,   Speech changes, word finding difficulties, paraphrasing errors. Gait difficulties with obvious left sided weakness. Tachycardia the COPD, the patient reports vertigo or spinning  sensation dizziness constipation and she has lost interest in several activities that used to give her pleasure.i  History   Social History  . Marital Status: Married    Spouse Name: N/A  . Number of Children: 3  . Years of Education: 9th grade   Occupational History  . retired    Social History Main Topics  . Smoking status: Never Smoker   . Smokeless tobacco: Never Used  . Alcohol Use: No  . Drug Use: No  . Sexual Activity: No   Other Topics Concern  . Not on file   Social History Narrative   Lives with daughter.     Caffeine none.    Family History  Problem Relation Age of Onset  . Liver cancer Father   . Heart disease Mother     No details  . Lung cancer Brother   . Cancer - Colon Sister     Past Medical History  Diagnosis Date  . CHF (congestive heart failure)     Preserved EF  . A-fib   . Hypertension   . GERD (gastroesophageal reflux disease)   . COPD (chronic obstructive pulmonary disease)     O2 at night  . Sleep apnea   . CAD (coronary artery disease)     Stent to circ 2005.    . MR (congenital mitral regurgitation)     Moderate  . Stroke     Past  Surgical History  Procedure Laterality Date  . Abdominal hysterectomy    . Replacement total knee    . Nissen fundoplication      Current Outpatient Prescriptions  Medication Sig Dispense Refill  . benzonatate (TESSALON) 100 MG capsule Take 1 capsule (100 mg total) by mouth 3 (three) times daily as needed for cough. 20 capsule 11  . cholecalciferol (VITAMIN D) 1000 UNITS tablet Take 1,000 Units by mouth daily.    . furosemide (LASIX) 40 MG tablet Take 1 tablet (40 mg total) by mouth daily. Take 40 mg daily, may take an additional dose daily for edema 135 tablet 3  . Magnesium Hydroxide (MILK OF MAGNESIA PO) Take by mouth.    . metoprolol succinate (TOPROL-XL) 25 MG 24 hr tablet Take 1 tablet (25 mg total) by mouth at bedtime. 90 tablet 3  . potassium chloride (K-DUR) 10 MEQ tablet Take 10 meq  daily,may take etra dose on days lasix is increased 135 tablet 3  . pravastatin (PRAVACHOL) 10 MG tablet Take 1 tablet (10 mg total) by mouth at bedtime. 90 tablet 3  . ranitidine (ZANTAC) 150 MG tablet Take 1 tablet (150 mg total) by mouth 2 (two) times daily with a meal. 180 tablet 3  . spironolactone (ALDACTONE) 25 MG tablet Take 0.5 tablets (12.5 mg total) by mouth daily. 45 tablet 3  . warfarin (COUMADIN) 5 MG tablet Take 5 mg by mouth daily at 6 PM. Take 1/2 tablet daily     No current facility-administered medications for this visit.    Allergies as of 01/26/2015 - Review Complete 01/26/2015  Allergen Reaction Noted  . Penicillins Itching and Swelling 11/04/2013    Vitals: BP 117/77 mmHg  Pulse 77  Resp 24  Ht 5\' 6"  (1.676 m)  Wt 177 lb (80.287 kg)  BMI 28.58 kg/m2 Last Weight:  Wt Readings from Last 1 Encounters:  01/26/15 177 lb (80.287 kg)   Last Height:   Ht Readings from Last 1 Encounters:  01/26/15 5\' 6"  (1.676 m)    Physical exam:  General: The patient is awake, alert and appears not in acute distress. The patient is well groomed. Head: Normocephalic, atraumatic. Neck is supple. Mallampati 2, neck circumference: 14.5. Cardiovascular:  irregular rate and rhythm, without  murmurs or carotid bruit, and without distended neck veins. Respiratory: Lungs are clear to auscultation. Skin:  Without evidence of edema, or rash Trunk: BMI is  elevated . Neurologic exam : The patient is awake and alert, oriented to place and time.  Memory subjective   described as intact.  There is a normal attention span & concentration ability. Speech is non fluent with all 3 components affected- dysarthria, dysphonia or aphasia. Mood and affect are appropriate.  Cranial nerves: Pupils are equal and briskly reactive to light. Funduscopic exam without  ; red , rosy retina, but unable to identify any vessels, or the macula. . Extraocular movements  in vertical  plane intact ,  but there is  nystagmus with horizontal eye movements to the left and right. This nystagmus does not fatigue. Nausea during exam.  Visual fields by finger perimetry are intact. Hearing to finger rub intact.  Facial sensation intact to fine touch. Facial motor strength is symmetric and tongue and uvula move midline.  Tongue protrusion into either cheek is normal. Shoulder shrug is weaker on the left    Motor exam:   Weaker general tone , left tone was stronger, waxy, ,muscle bulk was symmetric in all extremities.  Sensory:  Fine touch, pinprick and vibration were tested in all extremities. Proprioception was normal.  Coordination: Rapid alternating movements in the fingers/hands were normal.  Finger-to-nose maneuver  normal without evidence of ataxia, dysmetria or tremor. There was a mild pronator drift on the left.   Gait and station: Patient presents today in a wheelchair. According to her family she is able with assistance on both sides to walk she is however very unsteady and seems to drift backwards and forwards. She will place her right foot first apparently being able on the right to carry her body weight. The left will follow and is everted during her gait exam .  Ascend only with 2 persons assisting  Onto the exam table. The patient was able to sit without further assistance but during the eye movement exam he came dizzy and she drifts when dizzy towards the left.  Deep tendon reflexes: in the  upper and lower extremities are symmetric and intact. Babinski maneuver response is upgoing.   Assessment:  After physical and neurologic examination, review of laboratory studies, imaging, neurophysiology testing and pre-existing records, assessment is that of :  Mrs. Corton seems to have suffered a rather severe fall already in February it latched to her nose being fractured and she had several days of epistaxis due to her anticoagulated state. I'm concerned that she suffered a contusion or concussion with in  that. She may also have a frontal lobe damage from the fall as a horizontal smooth eye movements are affected. She has been unable to repeat since about that time. In addition she may have a traumatic vertigo. In March she fell again this time complaining of dizziness as the cause of the fall. Her note horizontal nystagmus would indicate that she could indeed have a posttraumatic vertigo. That her speech and her balance and her eye movements have changed is usually related to a cerebellar injury and this could have been a stroke the patient is at a higher stroke risk even when anticoagulated due to paroxysmal atrial fibrillation. Would have had embolic strokes in various regions occurring from this. In February she had a CT scan at the local [place] emergency room. And she suffered her second fall while visiting her daughter at her [place] home. I will order an MRI of the brain without contrast. If this MRI scan is unremarkable I would follow with transcranial Doppler studies and an emboli monitoring study.  She remains for now on anticoagulation and will need rehab , ST and PT .     Plan:  Treatment plan and additional workup : see PT and ST . April 7 th [place] , [facility]. I have discussed with Mrs. Cotterill family and the patient presents that I think it would be best to have the patient admitted.  We do have a certified stroke Center here in the [place] location and I feel that she will get her testing and workup in a faster way, I will contact the stroke physician on duty. If this cannot be done for any reason we will persist pursue the outpatient way and use the [facility] location as the fall back option. It is easier for the patient and family to reach [facility] in [place] for any outpatient care, therapies, diagnostic tests. [place] copy to Dr. Andrey Campanile ENT and to the patient's primary care physician, Dr. Sherril Croon.      Porfirio Mylar Md Smola  MD 01/26/2015

## 2015-01-26 NOTE — Progress Notes (Signed)
ED CM consulted concerning patient. Patient presented to [facility] ED s/p fall as per daughter. ED evaluation revealed no significant findings, ED CM met with patient and family at bedside, discussed recommendation for [facility] services, daughter reveals that patient has been receiving [facility] services. She reports that iit is very difficult on the family delivering care 24 hours/7 days per week. The family is interested in SNF Explained the 3 night qualifying stay criteria for SNF placement covered by Medicare family verbalized understanding. Patient is currently receiving OP PT for reconditioning as per daughter. Explained that patient does not currently meet criteria for inpatient admission, patient will be discharged to f/u with PCP, patient and family agreeable with discharge plan. No further ED CM needs identified.

## 2015-01-26 NOTE — ED Provider Notes (Signed)
CSN: [id number]     Arrival date & time 01/26/15  1054 History   First MD Initiated Contact with Patient 01/26/15 1121     Chief Complaint  Patient presents with  . Fall     (Consider location/radiation/quality/duration/timing/severity/associated sxs/prior Treatment) HPI Comments: Sent in by [facility] as patient has concern for stroke. Has fallen twice in the past month, with marked declines after each fall. She has word-finding difficulties and ambulatory difficulties, which are new. She's also having some L sided weakness which is new. Hx of stroke previously. She's on coumadin for Afib.  Patient is a 81 y.o. female presenting with fall. The history is provided by the patient.  Fall This is a recurrent problem. The current episode started more than 1 week ago. Episode frequency: every few weeks. The problem has not changed since onset.Pertinent negatives include no chest pain, no abdominal pain and no shortness of breath. Nothing aggravates the symptoms. Nothing relieves the symptoms.    Past Medical History  Diagnosis Date  . CHF (congestive heart failure)     Preserved EF  . A-fib   . Hypertension   . GERD (gastroesophageal reflux disease)   . COPD (chronic obstructive pulmonary disease)     O2 at night  . Sleep apnea   . CAD (coronary artery disease)     Stent to circ 2005.    . MR (congenital mitral regurgitation)     Moderate  . Stroke    Past Surgical History  Procedure Laterality Date  . Abdominal hysterectomy    . Replacement total knee    . Nissen fundoplication     Family History  Problem Relation Age of Onset  . Liver cancer Father   . Heart disease Mother     No details  . Lung cancer Brother   . Cancer - Colon Sister    History  Substance Use Topics  . Smoking status: Never Smoker   . Smokeless tobacco: Never Used  . Alcohol Use: No   OB History    No data available     Review of Systems  Constitutional: Negative for fever.   Respiratory: Negative for cough and shortness of breath.   Cardiovascular: Negative for chest pain.  Gastrointestinal: Negative for abdominal pain.  All other systems reviewed and are negative.     Allergies  Penicillins  Home Medications   Prior to Admission medications   Medication Sig Start Date End Date Taking? Authorizing Provider  benzonatate (TESSALON) 100 MG capsule Take 1 capsule (100 mg total) by mouth 3 (three) times daily as needed for cough. 01/09/15   Laqueta Linden, MD  cholecalciferol (VITAMIN D) 1000 UNITS tablet Take 1,000 Units by mouth daily.    Historical Provider, MD  furosemide (LASIX) 40 MG tablet Take 1 tablet (40 mg total) by mouth daily. Take 40 mg daily, may take an additional dose daily for edema 01/09/15   Laqueta Linden, MD  Magnesium Hydroxide (MILK OF MAGNESIA PO) Take by mouth.    Historical Provider, MD  metoprolol succinate (TOPROL-XL) 25 MG 24 hr tablet Take 1 tablet (25 mg total) by mouth at bedtime. Patient not taking: Reported on 01/26/2015 01/09/15   Laqueta Linden, MD  potassium chloride (K-DUR) 10 MEQ tablet Take 10 meq daily,may take etra dose on days lasix is increased 01/09/15   Laqueta Linden, MD  pravastatin (PRAVACHOL) 10 MG tablet Take 1 tablet (10 mg total) by mouth at bedtime. 01/09/15  Laqueta Linden, MD  ranitidine (ZANTAC) 150 MG tablet Take 1 tablet (150 mg total) by mouth 2 (two) times daily with a meal. 01/09/15   Laqueta Linden, MD  spironolactone (ALDACTONE) 25 MG tablet Take 0.5 tablets (12.5 mg total) by mouth daily. 01/09/15   Laqueta Linden, MD  warfarin (COUMADIN) 5 MG tablet Take 5 mg by mouth daily at 6 PM. Take 1/2 tablet daily    Historical Provider, MD   BP 118/69 mmHg  Pulse 83  Temp(Src) 97.3 F (36.3 C) (Oral)  Resp 24  Ht  (1.676 m)  Wt 177 lb (80.287 kg)  BMI 28.58 kg/m2  SpO2 97% Physical Exam  Constitutional: She is oriented to person, place, and time. She appears  well-developed and well-nourished. No distress.  HENT:  Head: Normocephalic and atraumatic.  Mouth/Throat: Oropharynx is clear and moist.  Eyes: EOM are normal. Pupils are equal, round, and reactive to light.  Neck: Normal range of motion. Neck supple.  Cardiovascular: Normal rate and regular rhythm.  Exam reveals no friction rub.   No murmur heard. Pulmonary/Chest: Effort normal and breath sounds normal. No respiratory distress. She has no wheezes. She has no rales.  Abdominal: Soft. She exhibits no distension. There is no tenderness. There is no rebound.  Musculoskeletal: Normal range of motion. She exhibits no edema.  Neurological: She is alert and oriented to person, place, and time. No cranial nerve deficit. She exhibits abnormal muscle tone (L arm, L leg mildly weaker). Coordination normal.  Skin: Skin is warm. She is not diaphoretic.  Nursing note and vitals reviewed.   ED Course  Procedures (including critical care time) Labs Review Labs Reviewed  CBC  BASIC METABOLIC PANEL  URINALYSIS, ROUTINE W REFLEX MICROSCOPIC  I-STAT TROPOININ, ED    Imaging Review Dg Chest 2 View  01/26/2015   CLINICAL DATA:  Weakness and shortness of breath.  EXAM: CHEST  2 VIEW  COMPARISON:  01/04/2015 and 11/26/2014  FINDINGS: Heart size and pulmonary vascularity are normal considering the AP portable technique. The lungs are clear. No effusions. No acute osseous abnormality. Slight tortuosity and calcification of the thoracic aorta.  IMPRESSION: No acute abnormalities.   Electronically Signed   By: Francene Boyers M.D.   On: 01/26/2015 12:43   Ct Head Wo Contrast  01/26/2015   CLINICAL DATA:  Fall in February 2016, RA core and dizziness  EXAM: CT HEAD WITHOUT CONTRAST  TECHNIQUE: Contiguous axial images were obtained from the base of the skull through the vertex without intravenous contrast.  COMPARISON:  01/04/2015  FINDINGS: No skull fracture is noted. Again noted nondisplaced fracture of the right  nasal bone.  No intracranial hemorrhage, mass effect or midline shift. Paranasal sinuses and mastoid air cells are unremarkable. Atherosclerotic calcifications of basilar artery and left vertebral artery.  Stable cerebral atrophy. Stable periventricular and patchy subcortical chronic white matter disease. No definite acute cortical infarction. No mass lesion is noted on this unenhanced scan.  IMPRESSION: No acute intracranial abnormality. Stable atrophy and chronic white matter disease.   Electronically Signed   By: Natasha Mead M.D.   On: 01/26/2015 12:32   Ct Pelvis Wo Contrast  01/26/2015   CLINICAL DATA:  Limp since February and concern for fracture. Reported outside radiographs negative for fracture. Initial encounter.  EXAM: CT PELVIS WITHOUT CONTRAST  TECHNIQUE: Multidetector CT imaging of the pelvis was performed following the standard protocol without intravenous contrast.  COMPARISON:  None.  FINDINGS: There is  no evidence of pelvic ring fracture or diastasis. The hips are located and intact. Although MRI is the preferred method for detecting occult fracture, since the patient's symptoms have been present since February, would expect some CT manifestation.  Osteopenia. There is advanced degenerative disc narrowing at L4-5 and L5-S1, with foraminal crowding. 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  No acute findings in the pelvis.  An immediately subcutaneous nodule in the left groin measures 21 mm. Location and smooth rounded appearance favors a dermal inclusion cyst rather than adenopathy.  IMPRESSION: 1. No acute osseous findings. 2. 2 cm mass in the left groin, likely a benign dermal inclusion cyst. Please confirm with exam.   Electronically Signed   By: Marnee SpringJonathon  Watts M.D.   On: 01/26/2015 15:46   Mr Brain Wo Contrast  01/26/2015   CLINICAL DATA:  Weakness.  Recent falls.  Atrial fibrillation.  EXAM: MRI HEAD WITHOUT CONTRAST  TECHNIQUE: Multiplanar, multiecho pulse sequences of the  brain and surrounding structures were obtained without intravenous contrast.  COMPARISON:  CT head 10/2014  FINDINGS: Negative for acute infarct.  Ventricle size normal.  Cerebral volume normal for age.  Mild chronic microvascular ischemic change in the white matter. Basal ganglia and brainstem normal. Cerebellum negative.  Negative for intracranial hemorrhage.  No subdural fluid collection  Negative for mass or edema.  No shift of the midline structures.  Air-fluid level left sphenoid sinus.  IMPRESSION: No acute abnormality. Age appropriate atrophy and chronic microvascular ischemic change.  Air-fluid level left sphenoid sinus.   Electronically Signed   By: Marlan Palauharles  Clark M.D.   On: 01/26/2015 13:25     EKG Interpretation None      MDM   Final diagnoses:  Weakness  Limp    42F sent in for admission for concern of stroke. Recent falls with decline in abilities after each fall. Now with L sided weakness, word finding difficulties, difficulties ambulating. Sent from Neurology. MR negative for stroke. CT of pelvis negative for fracture. Patient's daughter has been taking good care of her at home, using a wheelchair. They are having trouble finding placement for her. Daughter feels she needs rehab. I agree. Unable to admit for 3 night stay with negative work up. Case Management to see patient and discuss options. Patient is stable for discharge, unable to find reason for 3 night admission. Patient's daughter is working with rehab centers close [place], [place]. She is using a wheelchair to prevent falls, daughter has had home health evaluations previously, however daughter felt that she was doing just as much and more than home health would provide.    Elwin MochaBlair Vedder Brittian, MD 01/27/15 [phone]

## 2015-01-26 NOTE — ED Notes (Signed)
Pt. Fell on FEb 16 onto her face an broke her nose.  CT was done and negative.  Pt. On March became dizzy and fell again and [facility] and they sent her home the next day.  Since the fall she has not walked and has decreased vision in both eyes.  GCS is 14.  Skin is warm and pink and dry. Pt. Denies any pain or discomfort.

## 2015-01-26 NOTE — Discharge Instructions (Signed)

## 2015-01-30 ENCOUNTER — Encounter (HOSPITAL_COMMUNITY): Payer: Self-pay | Admitting: Emergency Medicine

## 2015-01-30 ENCOUNTER — Emergency Department (HOSPITAL_COMMUNITY): Payer: Medicare Other

## 2015-01-30 ENCOUNTER — Inpatient Hospital Stay (HOSPITAL_COMMUNITY)
Admission: EM | Admit: 2015-01-30 | Discharge: 2015-02-01 | DRG: 312 | Disposition: A | Discharge status: Home / Self Care | Payer: Medicare Other | Attending: Internal Medicine | Admitting: Internal Medicine

## 2015-01-30 DIAGNOSIS — Z8673 Personal history of transient ischemic attack (TIA), and cerebral infarction without residual deficits: Secondary | ICD-10-CM | POA: Diagnosis not present

## 2015-01-30 DIAGNOSIS — I5033 Acute on chronic diastolic (congestive) heart failure: Secondary | ICD-10-CM | POA: Diagnosis present

## 2015-01-30 DIAGNOSIS — J449 Chronic obstructive pulmonary disease, unspecified: Secondary | ICD-10-CM | POA: Diagnosis present

## 2015-01-30 DIAGNOSIS — Z955 Presence of coronary angioplasty implant and graft: Secondary | ICD-10-CM

## 2015-01-30 DIAGNOSIS — Z7901 Long term (current) use of anticoagulants: Secondary | ICD-10-CM

## 2015-01-30 DIAGNOSIS — Z66 Do not resuscitate: Secondary | ICD-10-CM | POA: Diagnosis present

## 2015-01-30 DIAGNOSIS — Z8249 Family history of ischemic heart disease and other diseases of the circulatory system: Secondary | ICD-10-CM

## 2015-01-30 DIAGNOSIS — M1990 Unspecified osteoarthritis, unspecified site: Secondary | ICD-10-CM | POA: Diagnosis present

## 2015-01-30 DIAGNOSIS — Z993 Dependence on wheelchair: Secondary | ICD-10-CM | POA: Diagnosis not present

## 2015-01-30 DIAGNOSIS — I10 Essential (primary) hypertension: Secondary | ICD-10-CM | POA: Diagnosis present

## 2015-01-30 DIAGNOSIS — I509 Heart failure, unspecified: Secondary | ICD-10-CM

## 2015-01-30 DIAGNOSIS — Z801 Family history of malignant neoplasm of trachea, bronchus and lung: Secondary | ICD-10-CM

## 2015-01-30 DIAGNOSIS — Z800 Family history of malignant neoplasm of digestive organs: Secondary | ICD-10-CM | POA: Diagnosis not present

## 2015-01-30 DIAGNOSIS — Z9181 History of falling: Secondary | ICD-10-CM | POA: Diagnosis not present

## 2015-01-30 DIAGNOSIS — K219 Gastro-esophageal reflux disease without esophagitis: Secondary | ICD-10-CM | POA: Diagnosis present

## 2015-01-30 DIAGNOSIS — I4891 Unspecified atrial fibrillation: Secondary | ICD-10-CM | POA: Diagnosis present

## 2015-01-30 DIAGNOSIS — W19XXXD Unspecified fall, subsequent encounter: Secondary | ICD-10-CM | POA: Diagnosis not present

## 2015-01-30 DIAGNOSIS — I482 Chronic atrial fibrillation: Secondary | ICD-10-CM | POA: Diagnosis present

## 2015-01-30 DIAGNOSIS — R269 Unspecified abnormalities of gait and mobility: Secondary | ICD-10-CM

## 2015-01-30 DIAGNOSIS — R55 Syncope and collapse: Principal | ICD-10-CM | POA: Diagnosis present

## 2015-01-30 DIAGNOSIS — I2510 Atherosclerotic heart disease of native coronary artery without angina pectoris: Secondary | ICD-10-CM | POA: Diagnosis present

## 2015-01-30 DIAGNOSIS — I480 Paroxysmal atrial fibrillation: Secondary | ICD-10-CM | POA: Diagnosis not present

## 2015-01-30 DIAGNOSIS — G4730 Sleep apnea, unspecified: Secondary | ICD-10-CM | POA: Diagnosis present

## 2015-01-30 DIAGNOSIS — I951 Orthostatic hypotension: Secondary | ICD-10-CM | POA: Diagnosis present

## 2015-01-30 DIAGNOSIS — I340 Nonrheumatic mitral (valve) insufficiency: Secondary | ICD-10-CM | POA: Diagnosis not present

## 2015-01-30 LAB — CBC WITH DIFFERENTIAL/PLATELET
BASOS ABS: 0.1 10*3/uL (ref 0.0–0.1)
Basophils Relative: 1 % (ref 0–1)
Eosinophils Absolute: 0.2 10*3/uL (ref 0.0–0.7)
Eosinophils Relative: 2 % (ref 0–5)
HEMATOCRIT: 44.2 % (ref 36.0–46.0)
HEMOGLOBIN: 13.9 g/dL (ref 12.0–15.0)
LYMPHS ABS: 2.5 10*3/uL (ref 0.7–4.0)
LYMPHS PCT: 25 % (ref 12–46)
MCH: 26.7 pg (ref 26.0–34.0)
MCHC: 31.4 g/dL (ref 30.0–36.0)
MCV: 84.8 fL (ref 78.0–100.0)
MONO ABS: 1 10*3/uL (ref 0.1–1.0)
Monocytes Relative: 10 % (ref 3–12)
NEUTROS ABS: 6.3 10*3/uL (ref 1.7–7.7)
Neutrophils Relative %: 62 % (ref 43–77)
Platelets: 307 10*3/uL (ref 150–400)
RBC: 5.21 MIL/uL — AB (ref 3.87–5.11)
RDW: 17.4 % — ABNORMAL HIGH (ref 11.5–15.5)
WBC: 10.1 10*3/uL (ref 4.0–10.5)

## 2015-01-30 LAB — BRAIN NATRIURETIC PEPTIDE: B Natriuretic Peptide: 433 pg/mL — ABNORMAL HIGH (ref 0.0–100.0)

## 2015-01-30 LAB — COMPREHENSIVE METABOLIC PANEL
ALBUMIN: 4.1 g/dL (ref 3.5–5.2)
ALK PHOS: 72 U/L (ref 39–117)
ALT: 21 U/L (ref 0–35)
ANION GAP: 10 (ref 5–15)
AST: 28 U/L (ref 0–37)
BUN: 20 mg/dL (ref 6–23)
CO2: 27 mmol/L (ref 19–32)
Calcium: 9.8 mg/dL (ref 8.4–10.5)
Chloride: 102 mmol/L (ref 96–112)
Creatinine, Ser: 1.1 mg/dL (ref 0.50–1.10)
GFR calc non Af Amer: 45 mL/min — ABNORMAL LOW (ref >90)
GFR, EST AFRICAN AMERICAN: 52 mL/min — AB (ref >90)
Glucose, Bld: 110 mg/dL — ABNORMAL HIGH (ref 70–99)
POTASSIUM: 4.2 mmol/L (ref 3.5–5.1)
Sodium: 139 mmol/L (ref 135–145)
TOTAL PROTEIN: 7.8 g/dL (ref 6.0–8.3)
Total Bilirubin: 1 mg/dL (ref 0.3–1.2)

## 2015-01-30 LAB — URINALYSIS, ROUTINE W REFLEX MICROSCOPIC
Bilirubin Urine: NEGATIVE
Glucose, UA: NEGATIVE mg/dL
Hgb urine dipstick: NEGATIVE
Ketones, ur: NEGATIVE mg/dL
Leukocytes, UA: NEGATIVE
Nitrite: NEGATIVE
PROTEIN: NEGATIVE mg/dL
Specific Gravity, Urine: 1.025 (ref 1.005–1.030)
UROBILINOGEN UA: 0.2 mg/dL (ref 0.0–1.0)
pH: 6 (ref 5.0–8.0)

## 2015-01-30 LAB — PROTIME-INR
INR: 1.96 — ABNORMAL HIGH (ref 0.00–1.49)
PROTHROMBIN TIME: 22.5 s — AB (ref 11.6–15.2)

## 2015-01-30 LAB — TROPONIN I: Troponin I: <0.03 ng/mL (ref <0.031)

## 2015-01-30 LAB — TSH: TSH: 2.84 u[IU]/mL (ref 0.350–4.500)

## 2015-01-30 MED ORDER — WARFARIN - PHARMACIST DOSING INPATIENT
Status: DC
  Administered 2015-01-31: 16:00:00

## 2015-01-30 MED ORDER — METOPROLOL SUCCINATE ER 25 MG PO TB24
25.0000 mg | ORAL_TABLET | Freq: Every day | ORAL | Status: DC
  Administered 2015-01-30 – 2015-01-31 (×2): 25 mg via ORAL
  Filled 2015-01-30 (×2): qty 1

## 2015-01-30 MED ORDER — FAMOTIDINE 20 MG PO TABS
20.0000 mg | ORAL_TABLET | Freq: Two times a day (BID) | ORAL | Status: DC
  Administered 2015-01-30 – 2015-02-01 (×4): 20 mg via ORAL
  Filled 2015-01-30 (×4): qty 1

## 2015-01-30 MED ORDER — WARFARIN SODIUM 5 MG PO TABS: 2.5000 mg | ORAL_TABLET | Freq: Once | ORAL | Status: DC

## 2015-01-30 MED ORDER — ONDANSETRON HCL 4 MG/2ML IJ SOLN: 4.0000 mg | Freq: Four times a day (QID) | INTRAMUSCULAR | Status: DC | PRN

## 2015-01-30 MED ORDER — ENSURE ENLIVE PO LIQD
237.0000 mL | Freq: Two times a day (BID) | ORAL | Status: DC
  Administered 2015-01-31 (×2): 237 mL via ORAL

## 2015-01-30 MED ORDER — ONDANSETRON HCL 4 MG PO TABS: 4.0000 mg | ORAL_TABLET | Freq: Four times a day (QID) | ORAL | Status: DC | PRN

## 2015-01-30 MED ORDER — SODIUM CHLORIDE 0.9 % IV SOLN
100.0000 mL/h | INTRAVENOUS | Status: DC
  Administered 2015-01-30: 100 mL/h via INTRAVENOUS

## 2015-01-30 MED ORDER — WARFARIN SODIUM 5 MG PO TABS: 2.5000 mg | ORAL_TABLET | Freq: Every day | ORAL | Status: DC

## 2015-01-30 MED ORDER — SODIUM CHLORIDE 0.9 % IV SOLN: INTRAVENOUS | Status: AC

## 2015-01-30 MED ORDER — SODIUM CHLORIDE 0.9 % IV BOLUS (SEPSIS)
500.0000 mL | Freq: Once | INTRAVENOUS | Status: AC
  Administered 2015-01-30: 500 mL via INTRAVENOUS

## 2015-01-30 MED ORDER — LORAZEPAM 2 MG/ML IJ SOLN
1.0000 mg | Freq: Once | INTRAMUSCULAR | Status: AC
  Administered 2015-01-30: 1 mg via INTRAVENOUS
  Filled 2015-01-30: qty 1

## 2015-01-30 MED ORDER — SODIUM CHLORIDE 0.9 % IV SOLN
INTRAVENOUS | Status: DC
  Administered 2015-01-30: 20:00:00 via INTRAVENOUS

## 2015-01-30 MED ORDER — VITAMIN D 1000 UNITS PO TABS
1000.0000 [IU] | ORAL_TABLET | Freq: Every day | ORAL | Status: DC
  Administered 2015-01-31 – 2015-02-01 (×2): 1000 [IU] via ORAL
  Filled 2015-01-30 (×3): qty 1

## 2015-01-30 MED ORDER — SODIUM CHLORIDE 0.9 % IJ SOLN
3.0000 mL | Freq: Two times a day (BID) | INTRAMUSCULAR | Status: DC
  Administered 2015-01-31 – 2015-02-01 (×2): 3 mL via INTRAVENOUS

## 2015-01-30 MED ORDER — PRAVASTATIN SODIUM 10 MG PO TABS
10.0000 mg | ORAL_TABLET | Freq: Every day | ORAL | Status: DC
  Administered 2015-01-30 – 2015-01-31 (×2): 10 mg via ORAL
  Filled 2015-01-30 (×2): qty 1

## 2015-01-30 MED ORDER — SPIRONOLACTONE 25 MG PO TABS
12.5000 mg | ORAL_TABLET | Freq: Every day | ORAL | Status: DC
  Administered 2015-01-31: 12.5 mg via ORAL
  Filled 2015-01-30 (×2): qty 1

## 2015-01-30 NOTE — H&P (Signed)
[facility] History and Physical  Sanaiyah Kirchhoff UEA:[id number] DOB: [birth year] DOA: 01/30/2015  Referring physician: ER PCP: Ignatius Specking., MD   Chief Complaint: Syncope  HPI: Sally White is a 81 y.o. female  This is an 81 year old lady who had a syncopal event today. Approximately 3 PM today, she was in the car with her daughter and her daughter was helping her with putting her seatbelt on. At this point, she suddenly became unconscious, her body became limp and her eyes rolled to the back. This episode lasted 2-3 minutes and with no evidence of convulsions/seizure activity. The daughter tells me that prior to getting into the car, patient had been complaining feeling dizzy and lightheaded. She has never had a such a syncopal episode before. She denied any chest pain, palpitations, dyspnea prior to the event. When she regained consciousness after 2- 3 minutes, she was completely alert and orientated and recognized her daughter. There does not appear to be a post ictal confusion. She had recently seen a neurologist because she had fallen in February with nasal injury. She went to see ENT, who then referred her to neurology, thinking she may have had a stroke. A neurologist in [place], Dr. Glenice Laine, felt that she may well have had a stroke on clinical grounds and was ordering an MRI scan. Since her fall in February, she has not really walked independently. The daughter relates that she has become rather slow in her thought process and movement. The patient has been receiving home health care and the family are concerned about her ability to stay at home. She is now being admitted for further management and investigation.   Review of Systems:  Apart from symptoms above, all systems negative.  Past Medical History  Diagnosis Date  . CHF (congestive heart failure)     Preserved EF  . A-fib   . Hypertension   . GERD (gastroesophageal reflux disease)   . COPD (chronic obstructive pulmonary  disease)     O2 at night  . Sleep apnea   . CAD (coronary artery disease)     Stent to circ 2005.    . MR (congenital mitral regurgitation)     Moderate  . Stroke    Past Surgical History  Procedure Laterality Date  . Abdominal hysterectomy    . Replacement total knee    . Nissen fundoplication     Social History:  reports that she has never smoked. She has never used smokeless tobacco. She reports that she does not drink alcohol or use illicit drugs.  Allergies  Allergen Reactions  . Penicillins Itching and Swelling    Family History  Problem Relation Age of Onset  . Liver cancer Father   . Heart disease Mother     No details  . Lung cancer Brother   . Cancer - Colon Sister     Prior to Admission medications   Medication Sig Start Date End Date Taking? Authorizing Provider  benzonatate (TESSALON) 100 MG capsule Take 1 capsule (100 mg total) by mouth 3 (three) times daily as needed for cough. 01/09/15  Yes Laqueta Linden, MD  cholecalciferol (VITAMIN D) 1000 UNITS tablet Take 1,000 Units by mouth daily.   Yes Historical Provider, MD  furosemide (LASIX) 40 MG tablet Take 1 tablet (40 mg total) by mouth daily. Take 40 mg daily, may take an additional dose daily for edema 01/09/15  Yes Laqueta Linden, MD  Magnesium Hydroxide (MILK OF MAGNESIA PO) Take 30 mLs by mouth  daily as needed (upset stomach).    Yes Historical Provider, MD  meclizine (ANTIVERT) 12.5 MG tablet Take 12.5 mg by mouth 2 (two) times daily as needed. dizziness 01/05/15  Yes Historical Provider, MD  metoprolol succinate (TOPROL-XL) 25 MG 24 hr tablet Take 1 tablet (25 mg total) by mouth at bedtime. 01/09/15  Yes Laqueta LindenSuresh A Koneswaran, MD  ondansetron (ZOFRAN) 4 MG tablet Take 4 mg by mouth every 6 (six) hours as needed. nausea 01/05/15  Yes Historical Provider, MD  potassium chloride (K-DUR) 10 MEQ tablet Take 10 meq daily,may take etra dose on days lasix is increased Patient taking differently: Take 10 mEq  by mouth daily. Take 10 meq daily,may take etra dose on days lasix is increased 01/09/15  Yes Laqueta LindenSuresh A Koneswaran, MD  pravastatin (PRAVACHOL) 10 MG tablet Take 1 tablet (10 mg total) by mouth at bedtime. 01/09/15  Yes Laqueta LindenSuresh A Koneswaran, MD  ranitidine (ZANTAC) 150 MG tablet Take 1 tablet (150 mg total) by mouth 2 (two) times daily with a meal. 01/09/15  Yes Laqueta LindenSuresh A Koneswaran, MD  spironolactone (ALDACTONE) 25 MG tablet Take 0.5 tablets (12.5 mg total) by mouth daily. 01/09/15  Yes Laqueta LindenSuresh A Koneswaran, MD  warfarin (COUMADIN) 5 MG tablet Take 2.5 mg by mouth daily at 6 PM. Take 1/2 tablet daily   Yes Historical Provider, MD   Physical Exam: Filed Vitals:   01/30/15 1630 01/30/15 1800 01/30/15 1830 01/30/15 1900  BP: 131/82 137/86 117/64 133/83  Pulse: 83 84 75 77  TempSrc:      Resp: 24 21 19 19   Height:      Weight:      SpO2: 99% 96% 93% 99%    Wt Readings from Last 3 Encounters:  01/30/15 79.379 kg (175 lb)  01/26/15 80.287 kg (177 lb)  01/26/15 80.287 kg (177 lb)    General:  Appears calm and comfortable. She does not communicate much with me at the present time. However she does appear to be orientated in time, place and person. Eyes: PERRL, normal lids, irises & conjunctiva ENT: grossly normal hearing, lips & tongue Neck: no LAD, masses or thyromegaly Cardiovascular: Irregular. Consistent with atrial fibrillation. Telemetry: Atrial fibrillation. Respiratory: CTA bilaterally, no w/r/r. Normal respiratory effort. Abdomen: soft, ntnd Skin: no rash or induration seen on limited exam Musculoskeletal: grossly normal tone BUE/BLE Psychiatric: grossly normal mood and affect, speech fluent and appropriate Neurologic: grossly non-focal.          Labs on Admission:  Basic Metabolic Panel:  Recent Labs Lab 01/26/15 1215 01/30/15 1631  NA 141 139  K 4.9 4.2  CL 108 102  CO2 24 27  GLUCOSE 97 110*  BUN 20 20  CREATININE 1.19* 1.10  CALCIUM 9.8 9.8   Liver Function  Tests:  Recent Labs Lab 01/30/15 1631  AST 28  ALT 21  ALKPHOS 72  BILITOT 1.0  PROT 7.8  ALBUMIN 4.1   No results for input(s): LIPASE, AMYLASE in the last 168 hours. No results for input(s): AMMONIA in the last 168 hours. CBC:  Recent Labs Lab 01/26/15 1215 01/30/15 1631  WBC 8.1 10.1  NEUTROABS  --  6.3  HGB 13.8 13.9  HCT 43.9 44.2  MCV 83.5 84.8  PLT 252 307   Cardiac Enzymes:  Recent Labs Lab 01/30/15 1631  TROPONINI <0.03    BNP (last 3 results)  Recent Labs  01/30/15 1631  BNP 433.0*    ProBNP (last 3 results) No results for input(s):  PROBNP in the last 8760 hours.  CBG: No results for input(s): GLUCAP in the last 168 hours.  Radiological Exams on Admission: Dg Chest 2 View  01/30/2015   CLINICAL DATA:  Syncope and dizziness  EXAM: CHEST  2 VIEW  COMPARISON:  January 26, 2015  FINDINGS: There is no edema or consolidation. Heart is upper normal in size. The pulmonary vascularity is within normal limits. No adenopathy. There is atherosclerotic change in the aorta. No bone lesions.  IMPRESSION: No edema or consolidation.  No change in cardiac silhouette.   Electronically Signed   By: Bretta Bang III M.D.   On: 01/30/2015 17:59   Mr Brain Wo Contrast  01/30/2015   CLINICAL DATA:  81 year old female with weakness and altered mental status x1 day. Agitation. Loss of consciousness. On Coumadin. Initial encounter.  EXAM: MRI HEAD WITHOUT CONTRAST  TECHNIQUE: Multiplanar, multiecho pulse sequences of the brain and surrounding structures were obtained without intravenous contrast.  COMPARISON:  Brain MRI 01/26/2015 and earlier.  FINDINGS: Today study is more degraded by motion than that 4 days ago. Stable cerebral volume. Major intracranial vascular flow voids appear stable, with a degree of intracranial artery dolichoectasia.  No restricted diffusion or evidence of acute infarction.  No midline shift, mass effect, or evidence of intracranial mass lesion. No  ventriculomegaly. No acute intracranial hemorrhage identified. Wallace Cullens and white matter signal appears stable allowing for motion artifact today.  Stable paranasal sinuses and mastoids. Grossly stable orbits soft tissues. Grossly stable visualized cervical spine.  IMPRESSION: No acute intracranial abnormality identified.  Allowing for motion artifact today, stable noncontrast MRI appearance the brain since 01/26/2015.   Electronically Signed   By: Odessa Fleming M.D.   On: 01/30/2015 17:40    EKG: Independently reviewed. Atrial fibrillation. No acute ST-T wave changes.  Assessment/Plan   1. Syncope. Etiology is unclear. There appears to be a neurological phenomenon. However, MRI brain scan is entirely normal. We will ask neurology consultation again. We will order echocardiogram. 2. Atrial fibrillation. She is on warfarin. She has had several falls in the past few weeks and I'm not sure that she should continue with anticoagulation. We will ask cardiology to address this question. 3. Debility. This appears to be present now for the last 4-6 weeks. Etiology is not entirely clear. We will check TSH as well as other blood work. We will last physical therapy to evaluate.  Further recommendations will depend on patient's hospital progress.   Code Status: DO NOT RESUSCITATE. This was confirmed by her daughter at the bedside  DVT Prophylaxis: On warfarin.  Family Communication: I discussed the plan with the daughter and son-in-law at the bedside.   Disposition Plan: Depending on progress. She may need disposition to a skilled nursing facility for a short period of time.   Time spent: 60 minutes.  Wilson Singer [facility] Pager [phone].

## 2015-01-30 NOTE — ED Provider Notes (Signed)
CSN: [id number]     Arrival date & time 01/30/15  1602 History   First MD Initiated Contact with Patient 01/30/15 1610     Chief Complaint  Patient presents with  . Loss of Consciousness    HPI  Patient presents with her daughter who assists with the history of present illness. In the hour prior to ED arrival the patient was sitting in a car, had an episode of non-prodromal syncope. Upon awakening, the patient complained of pain, diffusely in the head, dizziness, but no chest pain. Currently the patient has somewhat complex of dizziness, headache, but no chest pain, no dyspnea. She does complain of weakness, but denies asymmetry of her weakness. Patient has a notable history of multiple falls about 6 weeks ago, with subsequent decline in functionality, increasing bedbound status, persistent dizziness, unsteadiness. Patient was seen here 3 days ago with concern of stroke. During that evaluation patient MRI, CT scan, labs. Patient has been receiving home health care, and family is examining options for nursing home placement.   Past Medical History  Diagnosis Date  . CHF (congestive heart failure)     Preserved EF  . A-fib   . Hypertension   . GERD (gastroesophageal reflux disease)   . COPD (chronic obstructive pulmonary disease)     O2 at night  . Sleep apnea   . CAD (coronary artery disease)     Stent to circ 2005.    . MR (congenital mitral regurgitation)     Moderate  . Stroke    Past Surgical History  Procedure Laterality Date  . Abdominal hysterectomy    . Replacement total knee    . Nissen fundoplication     Family History  Problem Relation Age of Onset  . Liver cancer Father   . Heart disease Mother     No details  . Lung cancer Brother   . Cancer - Colon Sister    History  Substance Use Topics  . Smoking status: Never Smoker   . Smokeless tobacco: Never Used  . Alcohol Use: No   OB History    No data available     Review of Systems  Constitutional:        Per HPI, otherwise negative  HENT:       Per HPI, otherwise negative  Respiratory:       Per HPI, otherwise negative  Cardiovascular:       Per HPI, otherwise negative  Gastrointestinal: Negative for vomiting.  Endocrine:       Negative aside from HPI  Genitourinary:       Neg aside from HPI   Musculoskeletal:       Per HPI, otherwise negative  Skin: Negative for color change.  Neurological: Positive for dizziness, syncope, speech difficulty, weakness and light-headedness. Negative for seizures and facial asymmetry.      Allergies  Penicillins  Home Medications   Prior to Admission medications   Medication Sig Start Date End Date Taking? Authorizing Provider  benzonatate (TESSALON) 100 MG capsule Take 1 capsule (100 mg total) by mouth 3 (three) times daily as needed for cough. 01/09/15   Laqueta LindenSuresh A Koneswaran, MD  cholecalciferol (VITAMIN D) 1000 UNITS tablet Take 1,000 Units by mouth daily.    Historical Provider, MD  furosemide (LASIX) 40 MG tablet Take 1 tablet (40 mg total) by mouth daily. Take 40 mg daily, may take an additional dose daily for edema 01/09/15   Laqueta LindenSuresh A Koneswaran, MD  Magnesium Hydroxide (MILK  OF MAGNESIA PO) Take 30 mLs by mouth daily as needed (upset stomach).     Historical Provider, MD  meclizine (ANTIVERT) 12.5 MG tablet Take 12.5 mg by mouth 2 (two) times daily as needed. dizziness 01/05/15   Historical Provider, MD  metoprolol succinate (TOPROL-XL) 25 MG 24 hr tablet Take 1 tablet (25 mg total) by mouth at bedtime. 01/09/15   Laqueta Linden, MD  ondansetron (ZOFRAN) 4 MG tablet Take 4 mg by mouth every 6 (six) hours as needed. nausea 01/05/15   Historical Provider, MD  potassium chloride (K-DUR) 10 MEQ tablet Take 10 meq daily,may take etra dose on days lasix is increased 01/09/15   Laqueta Linden, MD  pravastatin (PRAVACHOL) 10 MG tablet Take 1 tablet (10 mg total) by mouth at bedtime. 01/09/15   Laqueta Linden, MD  ranitidine (ZANTAC)  150 MG tablet Take 1 tablet (150 mg total) by mouth 2 (two) times daily with a meal. 01/09/15   Laqueta Linden, MD  spironolactone (ALDACTONE) 25 MG tablet Take 0.5 tablets (12.5 mg total) by mouth daily. 01/09/15   Laqueta Linden, MD  warfarin (COUMADIN) 5 MG tablet Take 5 mg by mouth daily at 6 PM. Take 1/2 tablet daily    Historical Provider, MD   BP 162/88 mmHg  Pulse 92  Resp 25  Ht  (1.651 m)  Wt 175 lb (79.379 kg)  BMI 29.12 kg/m2  SpO2 100% Physical Exam  Constitutional: She appears cachectic. She has a sickly appearance.  Neurological: She displays atrophy. She displays no tremor. She exhibits abnormal muscle tone. She displays no seizure activity.  Patient is slow to respond, but speech is clear, brief, no facial asymmetry, patient has no pronator drift, no gross discoordination.   Psychiatric: She is slowed and withdrawn.    ED Course  Procedures (including critical care time) Labs Review Labs Reviewed  CBC WITH DIFFERENTIAL/PLATELET - Abnormal; Notable for the following:    RBC 5.21 (*)    RDW 17.4 (*)    All other components within normal limits  COMPREHENSIVE METABOLIC PANEL - Abnormal; Notable for the following:    Glucose, Bld 110 (*)    GFR calc non Af Amer 45 (*)    GFR calc Af Amer 52 (*)    All other components within normal limits  PROTIME-INR - Abnormal; Notable for the following:    Prothrombin Time 22.5 (*)    INR 1.96 (*)    All other components within normal limits  BRAIN NATRIURETIC PEPTIDE - Abnormal; Notable for the following:    B Natriuretic Peptide 433.0 (*)    All other components within normal limits  TROPONIN I  URINALYSIS, ROUTINE W REFLEX MICROSCOPIC    Imaging Review Dg Chest 2 View  01/30/2015   CLINICAL DATA:  Syncope and dizziness  EXAM: CHEST  2 VIEW  COMPARISON:  January 26, 2015  FINDINGS: There is no edema or consolidation. Heart is upper normal in size. The pulmonary vascularity is within normal limits. No adenopathy.  There is atherosclerotic change in the aorta. No bone lesions.  IMPRESSION: No edema or consolidation.  No change in cardiac silhouette.   Electronically Signed   By: Bretta Bang III M.D.   On: 01/30/2015 17:59   Mr Brain Wo Contrast  01/30/2015   CLINICAL DATA:  81 year old female with weakness and altered mental status x1 day. Agitation. Loss of consciousness. On Coumadin. Initial encounter.  EXAM: MRI HEAD WITHOUT CONTRAST  TECHNIQUE:  Multiplanar, multiecho pulse sequences of the brain and surrounding structures were obtained without intravenous contrast.  COMPARISON:  Brain MRI 01/26/2015 and earlier.  FINDINGS: Today study is more degraded by motion than that 4 days ago. Stable cerebral volume. Major intracranial vascular flow voids appear stable, with a degree of intracranial artery dolichoectasia.  No restricted diffusion or evidence of acute infarction.  No midline shift, mass effect, or evidence of intracranial mass lesion. No ventriculomegaly. No acute intracranial hemorrhage identified. Wallace Cullens and white matter signal appears stable allowing for motion artifact today.  Stable paranasal sinuses and mastoids. Grossly stable orbits soft tissues. Grossly stable visualized cervical spine.  IMPRESSION: No acute intracranial abnormality identified.  Allowing for motion artifact today, stable noncontrast MRI appearance the brain since 01/26/2015.   Electronically Signed   By: Odessa Fleming M.D.   On: 01/30/2015 17:40     EKG Interpretation   Date/Time:  Tuesday January 30 2015 16:19:24 EDT Ventricular Rate:  97 PR Interval:    QRS Duration: 141 QT Interval:  415 QTC Calculation: 527 R Axis:   54 Text Interpretation:  Atrial flutter with predominant 2:1 AV block Left  bundle branch block Baseline wander in lead(s) V2 Atrial fibrillation Left  bundle branch block No significant change since last tracing Abnormal ekg  Confirmed by Gerhard Munch  MD [phone]) on 01/30/2015 4:30:49 PM     After the  initial evaluation I reviewed the patient's chart, including a visit 3 days ago for dizziness, with concern for stroke.  Update: patient to MRI, no new complaints.    6:18 PM Patient back from MRI - no new complaints. UA obtained- pending.   BNP elevated, no clinical e/o acute decompensated HF.   MDM   Final diagnoses:  Syncope and collapse   this elderly female with multiple medical issues presents after an episode of syncope. Here the patient appears generally unwell, but in no distress. Patient's vital signs are stable, though she has persistent arrhythmia, atrial fibrillation versus atrial flutter on monitor. Patient has no recent evaluation including echocardiogram, carotid Dopplers, but does have MRI that was unremarkable today. Is performed the patient's history of anticoagulant use, the change in cognition, concern for spontaneous bleed. CT scan was not available at this facility. Patient's initial labs notable for elevated BNP, therapeutic INR. No evidence for pneumonia or Patient was admitted for further evaluation and management.  Gerhard Munch, MD 01/30/15 Ebony Cargo

## 2015-01-30 NOTE — ED Notes (Signed)
Family member states patient was sitting in car and passed out "for a couple of minutes." Patient denies pain, complaining of dizziness.

## 2015-01-30 NOTE — Progress Notes (Signed)
ANTICOAGULATION CONSULT NOTE - Initial Consult  Pharmacy Consult for Coumadin (chronic Rx PTA) Indication: atrial fibrillation  Allergies  Allergen Reactions  . Penicillins Itching and Swelling   Patient Measurements: Height:  (165.1 cm) Weight: 175 lb 11.3 oz (79.7 kg) IBW/kg (Calculated) : 57  Vital Signs: Temp: 97.7 F (36.5 C) (04/05 2022) Temp Source: Oral (04/05 2022) BP: 124/65 mmHg (04/05 2022) Pulse Rate: 83 (04/05 2022)  Labs:  Recent Labs  01/30/15 1631 01/30/15 1938  HGB 13.9  --   HCT 44.2  --   PLT 307  --   LABPROT 22.5*  --   INR 1.96*  --   CREATININE 1.10  --   TROPONINI <0.03 <0.03    Estimated Creatinine Clearance: 39.7 mL/min (by C-G formula based on Cr of 1.1).   Medical History: Past Medical History  Diagnosis Date  . CHF (congestive heart failure)     Preserved EF  . A-fib   . Hypertension   . GERD (gastroesophageal reflux disease)   . COPD (chronic obstructive pulmonary disease)     O2 at night  . Sleep apnea   . CAD (coronary artery disease)     Stent to circ 2005.    . MR (congenital mitral regurgitation)     Moderate  . Stroke     Medications:  Prescriptions prior to admission  Medication Sig Dispense Refill Last Dose  . benzonatate (TESSALON) 100 MG capsule Take 1 capsule (100 mg total) by mouth 3 (three) times daily as needed for cough. 20 capsule 11 Past Month at Unknown time  . cholecalciferol (VITAMIN D) 1000 UNITS tablet Take 1,000 Units by mouth daily.   01/30/2015 at Unknown time  . furosemide (LASIX) 40 MG tablet Take 1 tablet (40 mg total) by mouth daily. Take 40 mg daily, may take an additional dose daily for edema 135 tablet 3 01/30/2015 at Unknown time  . Magnesium Hydroxide (MILK OF MAGNESIA PO) Take 30 mLs by mouth daily as needed (upset stomach).    01/29/2015 at MORNING  . meclizine (ANTIVERT) 12.5 MG tablet Take 12.5 mg by mouth 2 (two) times daily as needed. dizziness  0 Past Month at Unknown time  .  metoprolol succinate (TOPROL-XL) 25 MG 24 hr tablet Take 1 tablet (25 mg total) by mouth at bedtime. 90 tablet 3 01/29/2015 at 2100  . ondansetron (ZOFRAN) 4 MG tablet Take 4 mg by mouth every 6 (six) hours as needed. nausea  0 Past Month at Unknown time  . potassium chloride (K-DUR) 10 MEQ tablet Take 10 meq daily,may take etra dose on days lasix is increased (Patient taking differently: Take 10 mEq by mouth daily. Take 10 meq daily,may take etra dose on days lasix is increased) 135 tablet 3 01/30/2015 at Unknown time  . pravastatin (PRAVACHOL) 10 MG tablet Take 1 tablet (10 mg total) by mouth at bedtime. 90 tablet 3 01/29/2015 at Unknown time  . ranitidine (ZANTAC) 150 MG tablet Take 1 tablet (150 mg total) by mouth 2 (two) times daily with a meal. 180 tablet 3 01/30/2015 at Unknown time  . spironolactone (ALDACTONE) 25 MG tablet Take 0.5 tablets (12.5 mg total) by mouth daily. 45 tablet 3 01/30/2015 at Unknown time  . warfarin (COUMADIN) 5 MG tablet Take 2.5 mg by mouth daily at 6 PM. Take 1/2 tablet daily   01/29/2015 at Unknown time    Assessment: 81yo female on chronic Coumadin PTA.  Home dose listed above.  INR slightly below  goal on admission.  Goal of Therapy:  INR 2-3 Monitor platelets by anticoagulation protocol: Yes   Plan:  Coumadin 2.5mg  po tonight x 1 INR daily  Valrie HartHall, Mazell Aylesworth A 01/30/2015,11:31 PM

## 2015-01-31 ENCOUNTER — Inpatient Hospital Stay (HOSPITAL_COMMUNITY): Payer: Medicare Other

## 2015-01-31 DIAGNOSIS — W19XXXD Unspecified fall, subsequent encounter: Secondary | ICD-10-CM

## 2015-01-31 DIAGNOSIS — I482 Chronic atrial fibrillation: Secondary | ICD-10-CM

## 2015-01-31 DIAGNOSIS — I2510 Atherosclerotic heart disease of native coronary artery without angina pectoris: Secondary | ICD-10-CM

## 2015-01-31 DIAGNOSIS — I5032 Chronic diastolic (congestive) heart failure: Secondary | ICD-10-CM

## 2015-01-31 DIAGNOSIS — R55 Syncope and collapse: Secondary | ICD-10-CM

## 2015-01-31 LAB — COMPREHENSIVE METABOLIC PANEL
ALT: 16 U/L (ref 0–35)
ANION GAP: 7 (ref 5–15)
AST: 21 U/L (ref 0–37)
Albumin: 3.2 g/dL — ABNORMAL LOW (ref 3.5–5.2)
Alkaline Phosphatase: 56 U/L (ref 39–117)
BUN: 17 mg/dL (ref 6–23)
CALCIUM: 9.2 mg/dL (ref 8.4–10.5)
CO2: 26 mmol/L (ref 19–32)
Chloride: 108 mmol/L (ref 96–112)
Creatinine, Ser: 0.94 mg/dL (ref 0.50–1.10)
GFR calc Af Amer: 63 mL/min — ABNORMAL LOW (ref >90)
GFR calc non Af Amer: 54 mL/min — ABNORMAL LOW (ref >90)
GLUCOSE: 100 mg/dL — AB (ref 70–99)
Potassium: 4.2 mmol/L (ref 3.5–5.1)
SODIUM: 141 mmol/L (ref 135–145)
TOTAL PROTEIN: 6.1 g/dL (ref 6.0–8.3)
Total Bilirubin: 1 mg/dL (ref 0.3–1.2)

## 2015-01-31 LAB — CBC
HCT: 39.1 % (ref 36.0–46.0)
HEMOGLOBIN: 12.1 g/dL (ref 12.0–15.0)
MCH: 26.2 pg (ref 26.0–34.0)
MCHC: 30.9 g/dL (ref 30.0–36.0)
MCV: 84.8 fL (ref 78.0–100.0)
Platelets: 275 10*3/uL (ref 150–400)
RBC: 4.61 MIL/uL (ref 3.87–5.11)
RDW: 17.4 % — ABNORMAL HIGH (ref 11.5–15.5)
WBC: 8.9 10*3/uL (ref 4.0–10.5)

## 2015-01-31 LAB — TROPONIN I
Troponin I: <0.03 ng/mL (ref <0.031)
Troponin I: <0.03 ng/mL (ref <0.031)

## 2015-01-31 LAB — PROTIME-INR
INR: 2.04 — ABNORMAL HIGH (ref 0.00–1.49)
Prothrombin Time: 23.2 seconds — ABNORMAL HIGH (ref 11.6–15.2)

## 2015-01-31 MED ORDER — POTASSIUM CHLORIDE CRYS ER 20 MEQ PO TBCR
20.0000 meq | EXTENDED_RELEASE_TABLET | Freq: Every day | ORAL | Status: DC
  Administered 2015-01-31 – 2015-02-01 (×2): 20 meq via ORAL
  Filled 2015-01-31 (×2): qty 1

## 2015-01-31 MED ORDER — FUROSEMIDE 20 MG PO TABS
20.0000 mg | ORAL_TABLET | Freq: Every day | ORAL | Status: DC
  Administered 2015-02-01: 20 mg via ORAL
  Filled 2015-01-31: qty 1

## 2015-01-31 MED ORDER — WARFARIN SODIUM 5 MG PO TABS
2.5000 mg | ORAL_TABLET | Freq: Once | ORAL | Status: AC
  Administered 2015-01-31: 2.5 mg via ORAL
  Filled 2015-01-31: qty 1

## 2015-01-31 MED ORDER — FUROSEMIDE 40 MG PO TABS
40.0000 mg | ORAL_TABLET | Freq: Every day | ORAL | Status: DC
  Administered 2015-01-31: 40 mg via ORAL
  Filled 2015-01-31: qty 1

## 2015-01-31 NOTE — Progress Notes (Addendum)
INITIAL NUTRITION ASSESSMENT  DOCUMENTATION CODES Per approved criteria  -Not Applicable   INTERVENTION: Ensure Enlive po BID, each supplement provides 350 kcal and 20 grams of protein    NUTRITION DIAGNOSIS: Predicted suboptimal intake related to stroke? Or other neurological changes? as evidenced by  Weight loss of 6% in one month and mild wasting in multiple regions..  Goal: Pt to meet >/= 90% of their estimated nutrition needs    Monitor:  Meal Completion, supplement intake  Reason for Assessment: Malnutrition Screen   81 y.o. female   ASSESSMENT: Pt presents with syncope episode. Pt appetite is currently very good 90-100% of meals consumed. She has mild wasting to clavicles and temporal, moderate loss to dorsal areas but otherwise nutrition exam normal.  Acute weight loss noted per hx 6% in one month. Pt is lethargic and is having difficulty providing details of diet hx. She is feeding herself and has been living with her daughter.  Height: Ht Readings from Last 1 Encounters:  01/30/15 5\' 5"  (1.651 m)    Weight: Wt Readings from Last 1 Encounters:  01/30/15 175 lb 11.3 oz (79.7 kg)    Ideal Body Weight: 125# (57 kg)  % Ideal Body Weight: 141%  Wt Readings from Last 10 Encounters:  01/30/15 175 lb 11.3 oz (79.7 kg)  01/26/15 177 lb (80.287 kg)  01/26/15 177 lb (80.287 kg)  01/09/15 175 lb 3.2 oz (79.47 kg)  12/26/14 188 lb (85.276 kg)    Usual Body Weight: 185-188#  % Usual Body Weight: 94%  BMI:  Body mass index is 29.24 kg/(m^2). overweight  Estimated Nutritional Needs: Kcal: 1300-1450 Protein: 70-80 gr Fluid: 1.3-1.5 liters   Skin: no new issues   Diet Order: Diet Heart Room service appropriate?: Yes; Fluid consistency:: Thin  EDUCATION NEEDS: -No education needs identified at this time   Intake/Output Summary (Last 24 hours) at 01/31/15 1014 Last data filed at 01/31/15 0900  Gross per 24 hour  Intake 1102.5 ml  Output    850 ml  Net   252.5 ml    Last BM: 01/29/15  Labs:   Recent Labs Lab 01/26/15 1215 01/30/15 1631 01/31/15 0715  NA 141 139 141  K 4.9 4.2 4.2  CL 108 102 108  CO2 24 27 26   BUN 20 20 17   CREATININE 1.19* 1.10 0.94  CALCIUM 9.8 9.8 9.2  GLUCOSE 97 110* 100*    CBG (last 3)  No results for input(s): GLUCAP in the last 72 hours.  Scheduled Meds: . sodium chloride   Intravenous STAT  . cholecalciferol  1,000 Units Oral Daily  . famotidine  20 mg Oral BID  . feeding supplement (ENSURE ENLIVE)  237 mL Oral BID BM  . furosemide  40 mg Oral Daily  . metoprolol succinate  25 mg Oral QHS  . potassium chloride  20 mEq Oral Daily  . pravastatin  10 mg Oral QHS  . sodium chloride  3 mL Intravenous Q12H  . spironolactone  12.5 mg Oral Daily  . warfarin  2.5 mg Oral Once  . Warfarin - Pharmacist Dosing Inpatient   Does not apply Q24H    Continuous Infusions:   Past Medical History  Diagnosis Date  . CHF (congestive heart failure)     Preserved EF  . A-fib   . Hypertension   . GERD (gastroesophageal reflux disease)   . COPD (chronic obstructive pulmonary disease)     O2 at night  . Sleep apnea   .  CAD (coronary artery disease)     Stent to circ 2005.    . MR (congenital mitral regurgitation)     Moderate  . Stroke     Past Surgical History  Procedure Laterality Date  . Abdominal hysterectomy    . Replacement total knee    . Nissen fundoplication      Royann Shivers MS,RD,CSG,LDN Office: [phone] Pager: [phone]

## 2015-01-31 NOTE — Evaluation (Signed)
Physical Therapy Evaluation Patient Details Name: Sally PootMyrtle White MRN: [id number] DOB: 12/03/1927 Today's Date: 01/31/2015   History of Present Illness  This is an 81 year old lady who had a syncopal event today. Approximately 3 PM today, she was in the car with her daughter and her daughter was helping her with putting her seatbelt on. At this point, she suddenly became unconscious, her body became limp and her eyes rolled to the back. This episode lasted 2-3 minutes and with no evidence of convulsions/seizure activity. The daughter tells me that prior to getting into the car, patient had been complaining feeling dizzy and lightheaded. She has never had a such a syncopal episode before. She denied any chest pain, palpitations, dyspnea prior to the event. When she regained consciousness after 2- 3 minutes, she was completely alert and orientated and recognized her daughter. There does not appear to be a post ictal confusion. She had recently seen a neurologist because she had fallen in February with nasal injury. She went to see ENT, who then referred her to neurology, thinking she may have had a stroke. [place] in [place], Dr. Glenice Laineomeiher, felt that she may well have had a stroke on clinical grounds and was ordering an MRI scan. Since her fall in February, she has not really walked independently. The daughter relates that she has become rather slow in her thought process and movement. The patient has been receiving home health care and the family are concerned about her ability to stay at home. She is now being admitted for further management and investigation.  Clinical Impression   Pt was seen for evaluation.  She is alert and oriented, able to follow directions.  Her speech is mildly slurred and volume is very soft.  She reports feeling lightheaded continually.  This feeling increases slightly with head rotation but is equal from right to left.  No nystagmus was noted.  Her supine BP=110/70.  Strength  was 4/5 bilaterally and coordination was WNL.  She was able to transfer to sitting independently but then needed bilateral hand support to maintain sitting.  There was no increase in lightheadedness with sitting.  She was able to come to standing with mod assist but max assist was needed to maintain stance as she falls backward.  She had no report of increased lightheadedness during stance.  Pt states that this problem has stemmed from a fall she had in February when she hit her head (nose was broken during this fall).  I am recommending SNF at d/c.  Pt defers to her daughter for all decisions.    Follow Up Recommendations SNF    Equipment Recommendations  None recommended by PT    Recommendations for Other Services OT consult     Precautions / Restrictions Precautions Precautions: Fall Restrictions Weight Bearing Restrictions: No      Mobility  Bed Mobility Overal bed mobility: Modified Independent                Transfers Overall transfer level: Needs assistance Equipment used: Rolling walker (2 wheeled) Transfers: Sit to/from Stand Sit to Stand: Mod assist         General transfer comment: pt is unable to stand without max assist to prevent her falling backward  Ambulation/Gait Ambulation/Gait assistance:  (unable)                                Balance Overall balance assessment: Needs assistance Sitting-balance support:  Feet supported;Bilateral upper extremity supported Sitting balance-Leahy Scale: Fair Sitting balance - Comments: unable to sit without 2 hand support   Standing balance support: Bilateral upper extremity supported Standing balance-Leahy Scale: Poor Standing balance comment: falls backward                             Pertinent Vitals/Pain Pain Assessment: No/denies pain    Home Living Family/patient expects to be discharged to:: Skilled nursing facility Living Arrangements: Children                     Prior Function Level of Independence: Needs assistance   Gait / Transfers Assistance Needed: transfers independently but has been needing assist with gait/walker due to lightheadedness  ADL's / Homemaking Assistance Needed: dresses and bathes independently        Hand Dominance   Dominant Hand: Right    Extremity/Trunk Assessment   Upper Extremity Assessment: Defer to OT evaluation           Lower Extremity Assessment: Overall WFL for tasks assessed      Cervical / Trunk Assessment: Kyphotic  Communication   Communication: Expressive difficulties (slightly slurred speech)  Cognition Arousal/Alertness: Awake/alert Behavior During Therapy: WFL for tasks assessed/performed Overall Cognitive Status: Within Functional Limits for tasks assessed                                    Assessment/Plan    PT Assessment Patient needs continued PT services  PT Diagnosis Difficulty walking;Generalized weakness;Other (comment) (decreased balance)   PT Problem List Decreased activity tolerance;Decreased balance;Decreased mobility;Cardiopulmonary status limiting activity  PT Treatment Interventions Gait training;Functional mobility training;Therapeutic exercise;Balance training   PT Goals (Current goals can be found in the Care Plan section) Acute Rehab PT Goals Patient Stated Goal: none stated PT Goal Formulation: With patient Time For Goal Achievement: 02/14/15 Potential to Achieve Goals: Fair    Frequency Min 3X/week   Barriers to discharge   none                   End of Session Equipment Utilized During Treatment: Gait belt Activity Tolerance: Patient limited by fatigue Patient left: in bed;with bed alarm set;with call bell/phone within reach Nurse Communication: Mobility status         Time: 1610-9604 PT Time Calculation (min) (ACUTE ONLY): 32 min   Charges:   PT Evaluation $Initial PT Evaluation Tier I: 1 Procedure              Konrad Penta 01/31/2015, 10:22 AM

## 2015-01-31 NOTE — Care Management Utilization Note (Signed)
UR completed 

## 2015-01-31 NOTE — Progress Notes (Signed)
Patient very unsteady on feet and lightheaded.  Unable to get 3 min standing orthostatic VS's.  Cardiology notified.

## 2015-01-31 NOTE — Care Management Note (Signed)
    Page 1 of 1   01/31/2015     3:08:04 PM CARE MANAGEMENT NOTE 01/31/2015  Patient:  Sally White,Sally White   Account Number:  [id number]  Date Initiated:  01/31/2015  Documentation initiated by:  Kathyrn SheriffHILDRESS,JESSICA  Subjective/Objective Assessment:   Pt admitted for syncope. Pt is from home, lives with daughter. Pt requires much assistance from daughter. Per PT's recommendation, pt plans to discharge to SNF. CSW is aware and will arrange for placement. No CM needs. at this time.     Action/Plan:   Anticipated DC Date:  02/01/2015   Anticipated DC Plan:  SKILLED NURSING FACILITY  In-house referral  Clinical Social Worker      DC Planning Services  CM consult      Choice offered to / List presented to:             Status of service:  Completed, signed off Medicare Important Message given?   (If response is "NO", the following Medicare IM given date fields will be blank) Date Medicare IM given:   Medicare IM given by:   Date Additional Medicare IM given:   Additional Medicare IM given by:    Discharge Disposition:  SKILLED NURSING FACILITY  Per UR Regulation:  Reviewed for med. necessity/level of care/duration of stay  If discussed at Long Length of Stay Meetings, dates discussed:    Comments:  01/31/2015 1500 Kathyrn SheriffJessica Childress, RN, MSN, CM

## 2015-01-31 NOTE — Consult Note (Addendum)
Reason for Consult:Syncope Referring Physician: PTH Cardiologist: Sally White is an 81 y.o. female.  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 Yesterday her daughter got her in the car and when she was helping put her seatbelt on the patient suddenly became unconscious with her body becoming limp and eyes rolling back. The episode lasted 2-3 minutes with no evidence of seizure activity. The patient had been complaining of feeling dizzy and lightheaded prior to this. She was completely alert and oriented when she came around. She has been seen by a neurologist recently after a fall in February and is being worked up for possible stroke. MRI was ordered. EKG shows atrial fibrillation with controlled rate nonspecific interventricular conduction delay and no change from prior tracings. MRI yesterday is negative.  Patient states that since her fall last month she is dizzy all the time and her vision has worsened to the point that she can't read anymore. She can't walk and is now wheelchair bound.She denies chest pain, palpitations. She does have dyspnea on exertion and occasionally at rest. Currently getting IV fluids at 50 cc/hr and a little short of breath.  Patient has history of CAD status post stenting of the circumflex in 2005 which was patent on cath in 2007 performed in [place]. Nuclear stress test in [place] on 01/10/13 was normal. Patient also has history of atrial fibrillation on Coumadin, and diastolic heart failure with multiple admissions to [facility] for this. Last 2-D echo 02/16/14 normal LV function EF 55-60% with moderate mitral, tricuspid and pulmonary regurgitation with mild left atrial enlargement.   Past Medical History  Diagnosis Date  . CHF (congestive heart failure)     Preserved EF  . A-fib   . Hypertension   . GERD (gastroesophageal reflux disease)   . COPD (chronic obstructive pulmonary disease)     O2  at night  . Sleep apnea   . CAD (coronary artery disease)     Stent to circ 2005.    . MR (congenital mitral regurgitation)     Moderate  . Stroke     Past Surgical History  Procedure Laterality Date  . Abdominal hysterectomy    . Replacement total knee    . Nissen fundoplication      Family History  Problem Relation Age of Onset  . Liver cancer Father   . Heart disease Mother     No details  . Lung cancer Brother   . Cancer - Colon Sister     Social History:  reports that she has never smoked. She has never used smokeless tobacco. She reports that she does not drink alcohol or use illicit drugs.  Allergies:  Allergies  Allergen Reactions  . Penicillins Itching and Swelling    Medications: Scheduled Meds: . sodium chloride   Intravenous STAT  . cholecalciferol  1,000 Units Oral Daily  . famotidine  20 mg Oral BID  . feeding supplement (ENSURE ENLIVE)  237 mL Oral BID BM  . metoprolol succinate  25 mg Oral QHS  . pravastatin  10 mg Oral QHS  . sodium chloride  3 mL Intravenous Q12H  . spironolactone  12.5 mg Oral Daily  . warfarin  2.5 mg Oral Once  . Warfarin - Pharmacist Dosing Inpatient   Does not apply Q24H   Continuous Infusions: . sodium chloride 50 mL/hr at 01/30/15 2029   PRN Meds:.ondansetron **OR** ondansetron (ZOFRAN) IV   Results for orders placed  or performed during the hospital encounter of 01/30/15 (from the past 48 hour(s))  CBC WITH DIFFERENTIAL     Status: Abnormal   Collection Time: 01/30/15  4:31 PM  Result Value Ref Range   WBC 10.1 4.0 - 10.5 K/uL   RBC 5.21 (H) 3.87 - 5.11 MIL/uL   Hemoglobin 13.9 12.0 - 15.0 g/dL   HCT 44.2 36.0 - 46.0 %   MCV 84.8 78.0 - 100.0 fL   MCH 26.7 26.0 - 34.0 pg   MCHC 31.4 30.0 - 36.0 g/dL   RDW 17.4 (H) 11.5 - 15.5 %   Platelets 307 150 - 400 K/uL   Neutrophils Relative % 62 43 - 77 %   Neutro Abs 6.3 1.7 - 7.7 K/uL   Lymphocytes Relative 25 12 - 46 %   Lymphs Abs 2.5 0.7 - 4.0 K/uL   Monocytes  Relative 10 3 - 12 %   Monocytes Absolute 1.0 0.1 - 1.0 K/uL   Eosinophils Relative 2 0 - 5 %   Eosinophils Absolute 0.2 0.0 - 0.7 K/uL   Basophils Relative 1 0 - 1 %   Basophils Absolute 0.1 0.0 - 0.1 K/uL  Comprehensive metabolic panel     Status: Abnormal   Collection Time: 01/30/15  4:31 PM  Result Value Ref Range   Sodium 139 135 - 145 mmol/L   Potassium 4.2 3.5 - 5.1 mmol/L   Chloride 102 96 - 112 mmol/L   CO2 27 19 - 32 mmol/L   Glucose, Bld 110 (H) 70 - 99 mg/dL   BUN 20 6 - 23 mg/dL   Creatinine, Ser 1.10 0.50 - 1.10 mg/dL   Calcium 9.8 8.4 - 10.5 mg/dL   Total Protein 7.8 6.0 - 8.3 g/dL   Albumin 4.1 3.5 - 5.2 g/dL   AST 28 0 - 37 U/L   ALT 21 0 - 35 U/L   Alkaline Phosphatase 72 39 - 117 U/L   Total Bilirubin 1.0 0.3 - 1.2 mg/dL   GFR calc non Af Amer 45 (L) >90 mL/min   GFR calc Af Amer 52 (L) >90 mL/min    Comment: (NOTE) The eGFR has been calculated using the CKD EPI equation. This calculation has not been validated in all clinical situations. eGFR's persistently <90 mL/min signify possible Chronic Kidney Disease.    Anion gap 10 5 - 15  Troponin I     Status: None   Collection Time: 01/30/15  4:31 PM  Result Value Ref Range   Troponin I <0.03 <0.031 ng/mL    Comment:        NO INDICATION OF MYOCARDIAL INJURY.   Protime-INR     Status: Abnormal   Collection Time: 01/30/15  4:31 PM  Result Value Ref Range   Prothrombin Time 22.5 (H) 11.6 - 15.2 seconds   INR 1.96 (H) 0.00 - 1.49  Brain natriuretic peptide     Status: Abnormal   Collection Time: 01/30/15  4:31 PM  Result Value Ref Range   B Natriuretic Peptide 433.0 (H) 0.0 - 100.0 pg/mL  TSH     Status: None   Collection Time: 01/30/15  4:31 PM  Result Value Ref Range   TSH 2.840 0.350 - 4.500 uIU/mL  Urinalysis, Routine w reflex microscopic     Status: None   Collection Time: 01/30/15  6:50 PM  Result Value Ref Range   Color, Urine YELLOW YELLOW   APPearance CLEAR CLEAR   Specific Gravity, Urine  1.025 1.005 -  1.030   pH 6.0 5.0 - 8.0   Glucose, UA NEGATIVE NEGATIVE mg/dL   Hgb urine dipstick NEGATIVE NEGATIVE   Bilirubin Urine NEGATIVE NEGATIVE   Ketones, ur NEGATIVE NEGATIVE mg/dL   Protein, ur NEGATIVE NEGATIVE mg/dL   Urobilinogen, UA 0.2 0.0 - 1.0 mg/dL   Nitrite NEGATIVE NEGATIVE   Leukocytes, UA NEGATIVE NEGATIVE    Comment: MICROSCOPIC NOT DONE ON URINES WITH NEGATIVE PROTEIN, BLOOD, LEUKOCYTES, NITRITE, OR GLUCOSE <1000 mg/dL.  Troponin I     Status: None   Collection Time: 01/30/15  7:38 PM  Result Value Ref Range   Troponin I <0.03 <0.031 ng/mL    Comment:        NO INDICATION OF MYOCARDIAL INJURY.   Troponin I     Status: None   Collection Time: 01/31/15  1:07 AM  Result Value Ref Range   Troponin I <0.03 <0.031 ng/mL    Comment:        NO INDICATION OF MYOCARDIAL INJURY.   Troponin I     Status: None   Collection Time: 01/31/15  7:15 AM  Result Value Ref Range   Troponin I <0.03 <0.031 ng/mL    Comment:        NO INDICATION OF MYOCARDIAL INJURY.   Comprehensive metabolic panel     Status: Abnormal   Collection Time: 01/31/15  7:15 AM  Result Value Ref Range   Sodium 141 135 - 145 mmol/L   Potassium 4.2 3.5 - 5.1 mmol/L   Chloride 108 96 - 112 mmol/L   CO2 26 19 - 32 mmol/L   Glucose, Bld 100 (H) 70 - 99 mg/dL   BUN 17 6 - 23 mg/dL   Creatinine, Ser 0.94 0.50 - 1.10 mg/dL   Calcium 9.2 8.4 - 10.5 mg/dL   Total Protein 6.1 6.0 - 8.3 g/dL   Albumin 3.2 (L) 3.5 - 5.2 g/dL   AST 21 0 - 37 U/L   ALT 16 0 - 35 U/L   Alkaline Phosphatase 56 39 - 117 U/L   Total Bilirubin 1.0 0.3 - 1.2 mg/dL   GFR calc non Af Amer 54 (L) >90 mL/min   GFR calc Af Amer 63 (L) >90 mL/min    Comment: (NOTE) The eGFR has been calculated using the CKD EPI equation. This calculation has not been validated in all clinical situations. eGFR's persistently <90 mL/min signify possible Chronic Kidney Disease.    Anion gap 7 5 - 15  CBC     Status: Abnormal   Collection  Time: 01/31/15  7:15 AM  Result Value Ref Range   WBC 8.9 4.0 - 10.5 K/uL   RBC 4.61 3.87 - 5.11 MIL/uL   Hemoglobin 12.1 12.0 - 15.0 g/dL   HCT 39.1 36.0 - 46.0 %   MCV 84.8 78.0 - 100.0 fL   MCH 26.2 26.0 - 34.0 pg   MCHC 30.9 30.0 - 36.0 g/dL   RDW 17.4 (H) 11.5 - 15.5 %   Platelets 275 150 - 400 K/uL  Protime-INR     Status: Abnormal   Collection Time: 01/31/15  7:15 AM  Result Value Ref Range   Prothrombin Time 23.2 (H) 11.6 - 15.2 seconds   INR 2.04 (H) 0.00 - 1.49    Dg Chest 2 View  01/30/2015   CLINICAL DATA:  Syncope and dizziness  EXAM: CHEST  2 VIEW  COMPARISON:  January 26, 2015  FINDINGS: There is no edema or consolidation. Heart is upper normal  in size. The pulmonary vascularity is within normal limits. No adenopathy. There is atherosclerotic change in the aorta. No bone lesions.  IMPRESSION: No edema or consolidation.  No change in cardiac silhouette.   Electronically Signed   By: Lowella Grip III M.D.   On: 01/30/2015 17:59   Mr Brain Wo Contrast  01/30/2015   CLINICAL DATA:  81 year old female with weakness and altered mental status x1 day. Agitation. Loss of consciousness. On Coumadin. Initial encounter.  EXAM: MRI HEAD WITHOUT CONTRAST  TECHNIQUE: Multiplanar, multiecho pulse sequences of the brain and surrounding structures were obtained without intravenous contrast.  COMPARISON:  Brain MRI 01/26/2015 and earlier.  FINDINGS: Today study is more degraded by motion than that 4 days ago. Stable cerebral volume. Major intracranial vascular flow voids appear stable, with a degree of intracranial artery dolichoectasia.  No restricted diffusion or evidence of acute infarction.  No midline shift, mass effect, or evidence of intracranial mass lesion. No ventriculomegaly. No acute intracranial hemorrhage identified. Pearline Cables and white matter signal appears stable allowing for motion artifact today.  Stable paranasal sinuses and mastoids. Grossly stable orbits soft tissues. Grossly stable  visualized cervical spine.  IMPRESSION: No acute intracranial abnormality identified.  Allowing for motion artifact today, stable noncontrast MRI appearance the brain since 01/26/2015.   Electronically Signed   By: Genevie Ann M.D.   On: 01/30/2015 17:40    ROS  See HPI Eyes: Can't read since fall last month-blurred vision Ears:Negative for hearing loss, tinnitus Cardiovascular: Negative for chest pain, palpitations,irregular heartbeat, positive for dyspnea, dyspnea on exertion, near-syncope,  and syncope, negative edema, claudication, cyanosis,.  Respiratory:   Negative for cough, hemoptysis, sleep disturbances due to breathing, sputum production and wheezing.   Endocrine: Negative for cold intolerance and heat intolerance.  Hematologic/Lymphatic: Negative for adenopathy and bleeding problem. Does not bruise/bleed easily.  Musculoskeletal: weak   Gastrointestinal: Negative for nausea, vomiting, reflux, abdominal pain, diarrhea, constipation.   Genitourinary: Negative for bladder incontinence, dysuria, flank pain, frequency, hematuria, hesitancy, nocturia and urgency.  Neurological: off balance, dizzy, can't read, syncope  Allergic/Immunologic: Negative for environmental allergies.  Blood pressure 119/69, pulse 76, temperature 97.3 F (36.3 C), temperature source Oral, resp. rate 20, height 5' 5"  (1.651 m), weight 175 lb 11.3 oz (79.7 kg), SpO2 99 %. Physical Exam PHYSICAL EXAM: Well-nournished, in no acute distress. Neck: No JVD, HJR, Bruit, or thyroid enlargement Lungs:Decreased BS with rales at left base Cardiovascular: irreg irreg, PMI not [id number],4/0 systolic murmur LSB, no gallops, bruit, thrill, or heave. Abdomen: BS normal. Soft without organomegaly, masses, lesions or tenderness. Extremities: without cyanosis, clubbing or edema. Good distal pulses bilateral SKin: Warm, no lesions or rashes  Musculoskeletal: No deformities Neuro: no focal signs    Assessment/Plan: Syncope: ?  Etiology. Has CAF, but controlled rate and no evidence of brady/tachy, but can't exclude. ?CVA with visual and balance changes. MRI negative. Has not been hypotensive. Will stop IV fluids. Check orthostatic BP's.  CAD: S/P Stent CFX 2005, patent cath 2007, negative nuclear stress 2014-[place] Troponins negative no symptoms.  Chronic atrial fib with controlled rate on Coumadin and Toprol  Acute on chronic diastolic CHF: d/c IV fluids. Rales on exam BNP 433. Resume Lasix, spironalactone, Kdur.  Recent fall with hip injury-being worked up by neurology for possible CVA  Valvular heart disease with moderate MR, TR, and PR, normal LV function echo 01/2014   Ermalinda Barrios 01/31/2015, 8:25 AM   Addendum: Patient's orthostatic BP's 119 lying dropped to 98 standing.  Will decrease Lasix 20 mg daily and stop spironolactone. Ermalinda Barrios   The patient was seen and examined, and I agree with the assessment and plan as documented above, with modifications as noted below. Pt known to me from first visit in clinic with me last month. I had a long discussion with Sally White, the patient's daughter and primary caregiver. Everything has essentially gone downhill ever since the patient's fall in February as per her daughter. Events surrounding yesterday's syncopal event described above. It lasted anywhere from 3-5 minutes, without antecedent chest pain, palpitations, or shortness of breath. Troponins have been normal, and ECG shows atrial fibrillation with a controlled ventricular response and IVCD. No evidence of decompensated heart failure.  Brain MRI did not reveal any acute abnormalities. No evidence of hypotension. CXR normal.  In order to rule out bradyarrhythmias and/or pauses as a potential etiology for syncope, I recommend placing a 30-day event monitor at the time of discharge and if this were unrevealing, I would recommend an implantable loop recorder. I will review the echocardiogram which was  just completed. For the time being, I would continue warfarin as her daughter is very involved in her care. However, I would not rule out the possibility of discontinuing this altogether in the future.  With regards to her living situation, it has become increasingly difficult for her daughter to take care of her at home as the patient is unable to do anything independently as she used to. I feel assisted living may be her best option. She is going to speak with social work here.

## 2015-01-31 NOTE — Consult Note (Signed)
Theba A. Merlene Laughter, MD     [email or web address]          Sally White is an 81 y.o. female.   ASSESSMENT/PLAN: 1. Episode of syncope and confusion of unclear etiology. I suspect this is most likely a vasovagal phenomenon if cardiac etiology is ruled out. Seizures are probably unlikely. Imaging has rule out ischemic events in the TIA seems unlikely. 2. Vertiginous symptoms of unclear etiology. This appears to be a chronic issue however with acute worsening.Central etiology has been ruled out given the unremarkable imaging. Benign paroxysmal positional vertigo is also unlikely given the character.  3. Gait disorder likely multifactorial including vertigo, osteoarthritis and aging. 4. Chronic atrial fibrillation on chronic warfarin therapy.  RECOMMENDATION: EEG Physical and occupational therapy.  Patient is an 81 year old white female who has had a few falls recently. She is back was recently evaluated by Dr. Claudina Lick from the [facility] group. The patient did have an imaging of the brain done a few days ago and this was unremarkable. Imaging was done because of the acute worsening of the patient's gait impairment and falling. It appears that the patient had a syncopal episode which lasted for about a few minutes or less. The patient was sitting probably putting her seatbelt on with her daughter when she simply slumped over and lost consciousness for a couple minutes. No tonic-clonic events are reported. It does not. The patient had oral trauma, bladder or bowel incontinence. She was amnestic to the event but returned to normal after a couple minutes. She has had an extensive workup which has been mostly unrevealing. The patient tells me that she has been having a lot of lightheadedness. She actually endorses that the dizziness is mostly a spindle-like sensation on questioning the patient. I cannot get a clear sense how the longus spells last that from what I can tell it appears that  she has them often lasting for several hours and at times entire day. She tells me that she seemed to have issues with her eyes crossing and having blurry vision with these dizziness. She denies associated dysarthria, dysphasia or diplopia.No focal neurological symptoms are reported. Symptoms does not appear to be associated with position. The review of systems is otherwise unremarkable.  GENERAL: This a pleasant overweight female in no acute distress.  HEENT: Supple. Atraumatic normocephalic.   ABDOMEN: soft  EXTREMITIES: No edema. Marked arthritic changes of the knees. She status post bilateral knee replacement.   BACK: Normal.  SKIN: Normal by inspection.    MENTAL STATUS: Alert and oriented to situation, person and location. She is not oriented to time. Speech, language and cognition are generally intact. Judgment and insight normal.   CRANIAL NERVES: Pupils are equal, round and reactive to light and accommodation; extra ocular movements are full, there is no significant nystagmus; visual fields are full; upper and lower facial muscles are normal in strength and symmetric, there is no flattening of the nasolabial folds; tongue is midline; uvula is midline; shoulder elevation is normal.  MOTOR: Normal tone, bulk and strength; no pronator drift.  COORDINATION: Left finger to nose is normal, right finger to nose is normal, No rest tremor; no intention tremor; no postural tremor; no bradykinesia.  REFLEXES: Deep tendon reflexes are symmetrical and normal. Babinski reflexes are flexor bilaterally.   SENSATION: Normal to light touch.  GAIT: Not tested.  Brain MRI done yesterday is reviewed in person. There is mild global atrophy commensurate to her age. 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 Nothing acute is seen.   CAROTID DOPPLER FINE   Blood pressure 138/62, pulse 84, temperature 98.1 F (36.7 C), temperature source Oral, resp. rate  20, height 5' 5"  (1.651 m), weight 79.7 kg (175 lb 11.3 oz), SpO2 100 %.  Past Medical History  Diagnosis Date  . CHF (congestive heart failure)     Preserved EF  . A-fib   . Hypertension   . GERD (gastroesophageal reflux disease)   . COPD (chronic obstructive pulmonary disease)     O2 at night  . Sleep apnea   . CAD (coronary artery disease)     Stent to circ 2005.    . MR (congenital mitral regurgitation)     Moderate  . Stroke     Past Surgical History  Procedure Laterality Date  . Abdominal hysterectomy    . Replacement total knee    . Nissen fundoplication      Family History  Problem Relation Age of Onset  . Liver cancer Father   . Heart disease Mother     No details  . Lung cancer Brother   . Cancer - Colon Sister     Social History:  reports that she has never smoked. She has never used smokeless tobacco. She reports that she does not drink alcohol or use illicit drugs.  Allergies:  Allergies  Allergen Reactions  . Penicillins Itching and Swelling    Medications: Prior to Admission medications   Medication Sig Start Date End Date Taking? Authorizing Provider  benzonatate (TESSALON) 100 MG capsule Take 1 capsule (100 mg total) by mouth 3 (three) times daily as needed for cough. 01/09/15  Yes Herminio Commons, MD  cholecalciferol (VITAMIN D) 1000 UNITS tablet Take 1,000 Units by mouth daily.   Yes Historical Provider, MD  furosemide (LASIX) 40 MG tablet Take 1 tablet (40 mg total) by mouth daily. Take 40 mg daily, may take an additional dose daily for edema 01/09/15  Yes Herminio Commons, MD  Magnesium Hydroxide (MILK OF MAGNESIA PO) Take 30 mLs by mouth daily as needed (upset stomach).    Yes Historical Provider, MD  meclizine (ANTIVERT) 12.5 MG tablet Take 12.5 mg by mouth 2 (two) times daily as needed. dizziness 01/05/15  Yes Historical Provider, MD  metoprolol succinate (TOPROL-XL) 25 MG 24 hr tablet Take 1 tablet (25 mg total) by mouth at bedtime.  01/09/15  Yes Herminio Commons, MD  ondansetron (ZOFRAN) 4 MG tablet Take 4 mg by mouth every 6 (six) hours as needed. nausea 01/05/15  Yes Historical Provider, MD  potassium chloride (K-DUR) 10 MEQ tablet Take 10 meq daily,may take etra dose on days lasix is increased Patient taking differently: Take 10 mEq by mouth daily. Take 10 meq daily,may take etra dose on days lasix is increased 01/09/15  Yes Herminio Commons, MD  pravastatin (PRAVACHOL) 10 MG tablet Take 1 tablet (10 mg total) by mouth at bedtime. 01/09/15  Yes Herminio Commons, MD  ranitidine (ZANTAC) 150 MG tablet Take 1 tablet (150 mg total) by mouth 2 (two) times daily with a meal. 01/09/15  Yes Herminio Commons, MD  spironolactone (ALDACTONE) 25 MG tablet Take 0.5 tablets (12.5 mg total) by mouth daily. 01/09/15  Yes Herminio Commons, MD  warfarin (COUMADIN) 5 MG tablet Take 2.5 mg by mouth daily at 6 PM. Take 1/2 tablet daily   Yes Historical Provider, MD    Scheduled Meds: . cholecalciferol  1,000 Units Oral Daily  . famotidine  20 mg Oral BID  . feeding supplement (ENSURE ENLIVE)  237 mL Oral BID BM  . [START ON 02/01/2015] furosemide  20 mg Oral Daily  . metoprolol succinate  25 mg Oral QHS  . potassium chloride  20 mEq Oral Daily  . pravastatin  10 mg Oral QHS  . sodium chloride  3 mL Intravenous Q12H  . Warfarin - Pharmacist Dosing Inpatient   Does not apply Q24H   Continuous Infusions:  PRN Meds:.ondansetron **OR** ondansetron (ZOFRAN) IV     Results for orders placed or performed during the hospital encounter of 01/30/15 (from the past 48 hour(s))  CBC WITH DIFFERENTIAL     Status: Abnormal   Collection Time: 01/30/15  4:31 PM  Result Value Ref Range   WBC 10.1 4.0 - 10.5 K/uL   RBC 5.21 (H) 3.87 - 5.11 MIL/uL   Hemoglobin 13.9 12.0 - 15.0 g/dL   HCT 44.2 36.0 - 46.0 %   MCV 84.8 78.0 - 100.0 fL   MCH 26.7 26.0 - 34.0 pg   MCHC 31.4 30.0 - 36.0 g/dL   RDW 17.4 (H) 11.5 - 15.5 %   Platelets 307 150 -  400 K/uL   Neutrophils Relative % 62 43 - 77 %   Neutro Abs 6.3 1.7 - 7.7 K/uL   Lymphocytes Relative 25 12 - 46 %   Lymphs Abs 2.5 0.7 - 4.0 K/uL   Monocytes Relative 10 3 - 12 %   Monocytes Absolute 1.0 0.1 - 1.0 K/uL   Eosinophils Relative 2 0 - 5 %   Eosinophils Absolute 0.2 0.0 - 0.7 K/uL   Basophils Relative 1 0 - 1 %   Basophils Absolute 0.1 0.0 - 0.1 K/uL  Comprehensive metabolic panel     Status: Abnormal   Collection Time: 01/30/15  4:31 PM  Result Value Ref Range   Sodium 139 135 - 145 mmol/L   Potassium 4.2 3.5 - 5.1 mmol/L   Chloride 102 96 - 112 mmol/L   CO2 27 19 - 32 mmol/L   Glucose, Bld 110 (H) 70 - 99 mg/dL   BUN 20 6 - 23 mg/dL   Creatinine, Ser 1.10 0.50 - 1.10 mg/dL   Calcium 9.8 8.4 - 10.5 mg/dL   Total Protein 7.8 6.0 - 8.3 g/dL   Albumin 4.1 3.5 - 5.2 g/dL   AST 28 0 - 37 U/L   ALT 21 0 - 35 U/L   Alkaline Phosphatase 72 39 - 117 U/L   Total Bilirubin 1.0 0.3 - 1.2 mg/dL   GFR calc non Af Amer 45 (L) >90 mL/min   GFR calc Af Amer 52 (L) >90 mL/min    Comment: (NOTE) The eGFR has been calculated using the CKD EPI equation. This calculation has not been validated in all clinical situations. eGFR's persistently <90 mL/min signify possible Chronic Kidney Disease.    Anion gap 10 5 - 15  Troponin I     Status: None   Collection Time: 01/30/15  4:31 PM  Result Value Ref Range   Troponin I <0.03 <0.031 ng/mL    Comment:        NO INDICATION OF MYOCARDIAL INJURY.   Protime-INR     Status: Abnormal   Collection Time: 01/30/15  4:31 PM  Result Value Ref Range   Prothrombin Time 22.5 (H) 11.6 - 15.2 seconds   INR 1.96 (H) 0.00 - 1.49  Brain natriuretic peptide  Status: Abnormal   Collection Time: 01/30/15  4:31 PM  Result Value Ref Range   B Natriuretic Peptide 433.0 (H) 0.0 - 100.0 pg/mL  TSH     Status: None   Collection Time: 01/30/15  4:31 PM  Result Value Ref Range   TSH 2.840 0.350 - 4.500 uIU/mL  Urinalysis, Routine w reflex  microscopic     Status: None   Collection Time: 01/30/15  6:50 PM  Result Value Ref Range   Color, Urine YELLOW YELLOW   APPearance CLEAR CLEAR   Specific Gravity, Urine 1.025 1.005 - 1.030   pH 6.0 5.0 - 8.0   Glucose, UA NEGATIVE NEGATIVE mg/dL   Hgb urine dipstick NEGATIVE NEGATIVE   Bilirubin Urine NEGATIVE NEGATIVE   Ketones, ur NEGATIVE NEGATIVE mg/dL   Protein, ur NEGATIVE NEGATIVE mg/dL   Urobilinogen, UA 0.2 0.0 - 1.0 mg/dL   Nitrite NEGATIVE NEGATIVE   Leukocytes, UA NEGATIVE NEGATIVE    Comment: MICROSCOPIC NOT DONE ON URINES WITH NEGATIVE PROTEIN, BLOOD, LEUKOCYTES, NITRITE, OR GLUCOSE <1000 mg/dL.  Troponin I     Status: None   Collection Time: 01/30/15  7:38 PM  Result Value Ref Range   Troponin I <0.03 <0.031 ng/mL    Comment:        NO INDICATION OF MYOCARDIAL INJURY.   Troponin I     Status: None   Collection Time: 01/31/15  1:07 AM  Result Value Ref Range   Troponin I <0.03 <0.031 ng/mL    Comment:        NO INDICATION OF MYOCARDIAL INJURY.   Troponin I     Status: None   Collection Time: 01/31/15  7:15 AM  Result Value Ref Range   Troponin I <0.03 <0.031 ng/mL    Comment:        NO INDICATION OF MYOCARDIAL INJURY.   Comprehensive metabolic panel     Status: Abnormal   Collection Time: 01/31/15  7:15 AM  Result Value Ref Range   Sodium 141 135 - 145 mmol/L   Potassium 4.2 3.5 - 5.1 mmol/L   Chloride 108 96 - 112 mmol/L   CO2 26 19 - 32 mmol/L   Glucose, Bld 100 (H) 70 - 99 mg/dL   BUN 17 6 - 23 mg/dL   Creatinine, Ser 0.94 0.50 - 1.10 mg/dL   Calcium 9.2 8.4 - 10.5 mg/dL   Total Protein 6.1 6.0 - 8.3 g/dL   Albumin 3.2 (L) 3.5 - 5.2 g/dL   AST 21 0 - 37 U/L   ALT 16 0 - 35 U/L   Alkaline Phosphatase 56 39 - 117 U/L   Total Bilirubin 1.0 0.3 - 1.2 mg/dL   GFR calc non Af Amer 54 (L) >90 mL/min   GFR calc Af Amer 63 (L) >90 mL/min    Comment: (NOTE) The eGFR has been calculated using the CKD EPI equation. This calculation has not been  validated in all clinical situations. eGFR's persistently <90 mL/min signify possible Chronic Kidney Disease.    Anion gap 7 5 - 15  CBC     Status: Abnormal   Collection Time: 01/31/15  7:15 AM  Result Value Ref Range   WBC 8.9 4.0 - 10.5 K/uL   RBC 4.61 3.87 - 5.11 MIL/uL   Hemoglobin 12.1 12.0 - 15.0 g/dL   HCT 39.1 36.0 - 46.0 %   MCV 84.8 78.0 - 100.0 fL   MCH 26.2 26.0 - 34.0 pg   MCHC 30.9 30.0 -  36.0 g/dL   RDW 17.4 (H) 11.5 - 15.5 %   Platelets 275 150 - 400 K/uL  Protime-INR     Status: Abnormal   Collection Time: 01/31/15  7:15 AM  Result Value Ref Range   Prothrombin Time 23.2 (H) 11.6 - 15.2 seconds   INR 2.04 (H) 0.00 - 1.49    Studies/Results:     Lennard Capek A. Merlene Laughter, M.D.  Diplomate, Tax adviser of Psychiatry and Neurology ( Neurology). 01/31/2015, 10:27 PM

## 2015-01-31 NOTE — Clinical Social Work Psychosocial (Signed)
Clinical Social Work Department BRIEF PSYCHOSOCIAL ASSESSMENT 01/31/2015  Patient:  Sally White, Sally White     Account Number:  [id number]     Admit date:  01/30/2015  Clinical Social Worker:  Wyatt Haste  Date/Time:  01/31/2015 11:42 AM  Referred by:  CSW  Date Referred:  01/31/2015 Referred for  SNF Placement   Other Referral:   Interview type:  Family Other interview type:   daughter- Derenda    PSYCHOSOCIAL DATA Living Status:  FAMILY Admitted from facility:   Level of care:   Primary support name:  Derenda Primary support relationship to patient:  CHILD, ADULT Degree of support available:   supportive    CURRENT CONCERNS Current Concerns  Post-Acute Placement   Other Concerns:    SOCIAL WORK ASSESSMENT / PLAN CSW met with pt and pt's daughter, Derenda at bedside. Pt was resting during most of assessment and had told PT that she wanted to defer all decisions to [facility]. She indicates that she moved pt from her home in [place] to [place] to live with her at the end of last year. Pt required assistance and was living alone at that point. She has three children. Both sons live out of town. Derenda has made multiple accomodations to her home in order to meet pt's needs, including ramps, rails, and hospital bed. However, she reports that this has worn her out. Derenda has been getting up multiple times at night and all day. She feels she has been a nursing home for pt and has done this as long as she can for right now. CSW provided support. Derenda has discussed placement with pt and she was agreeable. PT evaluated pt this morning and recommendation is for SNF. CSW discussed placement process, including Medicare coverage/criteria. Derenda requests that pt be close to her [place]. CSW will initiate referral to facilities requested.   Assessment/plan status:  Psychosocial Support/Ongoing Assessment of Needs Other assessment/ plan:   Information/referral to community resources:    SNF list    PATIENT'S/FAMILY'S RESPONSE TO PLAN OF CARE: Pt defers decisions on placement to daughter. Derenda is experiencing caregiver fatigue after months of providing around the clock care for pt. She feels SNF is best for pt right now. CSW will follow up.       Benay Pike, [place]

## 2015-01-31 NOTE — Clinical Social Work Placement (Signed)
Clinical Social Work Department CLINICAL SOCIAL WORK PLACEMENT NOTE 01/31/2015  Patient:  Sally White,Sally White  Account Number:  [id number] Admit date:  01/30/2015  Clinical Social Worker:  Derenda FennelKARA Tyquarius Paglia, LCSW  Date/time:  01/31/2015 11:37 AM  Clinical Social Work is seeking post-discharge placement for this patient at the following level of care:   SKILLED NURSING   (*CSW will update this form in Epic as items are completed)   01/31/2015  Patient/family provided with [facility] Department of Clinical Social Work's list of facilities offering this level of care within the geographic area requested by the patient (or if unable, by the patient's family).  01/31/2015  Patient/family informed of their freedom to choose among providers that offer the needed level of care, that participate in Medicare, Medicaid or managed care program needed by the patient, have an available bed and are willing to accept the patient.  01/31/2015  Patient/family informed of [facility]' ownership interest in [facility], as well as of the fact that they are under no obligation to receive care at this facility.  PASARR submitted to EDS on  PASARR number received on   FL2 transmitted to all facilities in geographic area requested by pt/family on  01/31/2015 FL2 transmitted to all facilities within larger geographic area on   Patient informed that his/her managed care company has contracts with or will negotiate with  certain facilities, including the following:     Patient/family informed of bed offers received:   Patient chooses bed at  Physician recommends and patient chooses bed at    Patient to be transferred to  on   Patient to be transferred to facility by  Patient and family notified of transfer on  Name of family member notified:    The following physician request were entered in Epic:   Additional Comments: No pasarr needed per [place] facilities.  Derenda FennelKara Yazlyn Wentzel, KentuckyLCSW [phone]

## 2015-01-31 NOTE — Progress Notes (Signed)
  Echocardiogram 2D Echocardiogram has been performed.  Stacey DrainWhite, Angalina Ante J 01/31/2015, 11:11 AM

## 2015-01-31 NOTE — Progress Notes (Signed)
[facility] PROGRESS NOTE  Sally PootMyrtle White ZOX:[id number] DOB: 07/21/1928 DOA: 01/30/2015 PCP: Sally White  Assessment/Plan: 1. Syncope. Etiology is unclear. MRI brain scan is entirely normal. Echocardiogram EF 50%, no events on tele,  orthtostatic from lying to sitting so lasix decreased and spironolactone discontinued. No further episodes but complains "swimmy headed". Evaluated by cardiology who recommend event monitor at discharge.  2. Atrial fibrillation. She is on warfarin. Cardiology recommending to continue for now. Rate controlled 3. Debility. Worsening since fall in feb per chart review. TSH within the limits of normal. Pt recommending snf.   Code Status: DNR Family Communication: none present Disposition Plan: snf   Consultants:  cardiology  Procedures:  Echo 01/31/15 There was mild concentric hypertrophy. Systolic function was low normal. The estimated ejection fraction was approximately 50%. The study was not technically sufficient to allow evaluation of LV diastolic dysfunction due to atrial fibrillation. Doppler parameters are consistent with both elevated ventricular end-diastolic filling pressure and elevated left atrial filling pressure. Medial E/e&'    Antibiotics: HPI/Subjective[facility]. Denies pain but states "im swimmy headed".   Objective: Filed Vitals:   01/31/15 1401  BP: 129/73  Pulse: 72  Temp: 98 F (36.7 C)  Resp: 20    Intake/Output Summary (Last 24 hours) at 01/31/15 1557 Last data filed at 01/31/15 1333  Gross per 24 hour  Intake 1342.5 ml  Output   1000 ml  Net  342.5 ml   Filed Weights   01/30/15 1621 01/30/15 2022  Weight: 79.379 kg (175 lb) 79.7 kg (175 lb 11.3 oz)    Exam:   General:  Obese appears comfortable  Cardiovascular: irregularly irregular +murmur no gallup no LE edema  Respiratory: normal effort BS with decreased air flow but clear  Abdomen: obese  soft +BS non-tender  Musculoskeletal: no clubbing or cyanosis   Data Reviewed: Basic Metabolic Panel:  Recent Labs Lab 01/26/15 1215 01/30/15 1631 01/31/15 0715  NA 141 139 141  K 4.9 4.2 4.2  CL 108 102 108  CO2 24 27 26   GLUCOSE 97 110* 100*  BUN 20 20 17   CREATININE 1.19* 1.10 0.94  CALCIUM 9.8 9.8 9.2   Liver Function Tests:  Recent Labs Lab 01/30/15 1631 01/31/15 0715  AST 28 21  ALT 21 16  ALKPHOS 72 56  BILITOT 1.0 1.0  PROT 7.8 6.1  ALBUMIN 4.1 3.2*   No results for input(s): LIPASE, AMYLASE in the last 168 hours. No results for input(s): AMMONIA in the last 168 hours. CBC:  Recent Labs Lab 01/26/15 1215 01/30/15 1631 01/31/15 0715  WBC 8.1 10.1 8.9  NEUTROABS  --  6.3  --   HGB 13.8 13.9 12.1  HCT 43.9 44.2 39.1  MCV 83.5 84.8 84.8  PLT 252 307 275   Cardiac Enzymes:  Recent Labs Lab 01/30/15 1631 01/30/15 1938 01/31/15 0107 01/31/15 0715  TROPONINI <0.03 <0.03 <0.03 <0.03   BNP (last 3 results)  Recent Labs  01/30/15 1631  BNP 433.0*    ProBNP (last 3 results) No results for input(s): PROBNP in the last 8760 hours.  CBG: No results for input(s): GLUCAP in the last 168 hours.  No results found for this or any previous visit (from the past 240 hour(s)).   Studies: Dg Chest 2 View  01/30/2015   CLINICAL DATA:  Syncope and dizziness  EXAM: CHEST  2 VIEW  COMPARISON:  January 26, 2015  FINDINGS: There is no edema  or consolidation. Heart is upper normal in size. The pulmonary vascularity is within normal limits. No adenopathy. There is atherosclerotic change in the aorta. No bone lesions.  IMPRESSION: No edema or consolidation.  No change in cardiac silhouette.   Electronically Signed   By: Bretta Bang III M.D.   On: 01/30/2015 17:59   Mr Brain Wo Contrast  01/30/2015   CLINICAL DATA:  81 year old female with weakness and altered mental status x1 day. Agitation. Loss of consciousness. On Coumadin. Initial encounter.  EXAM: MRI HEAD  WITHOUT CONTRAST  TECHNIQUE: Multiplanar, multiecho pulse sequences of the brain and surrounding structures were obtained without intravenous contrast.  COMPARISON:  Brain MRI 01/26/2015 and earlier.  FINDINGS: Today study is more degraded by motion than that 4 days ago. Stable cerebral volume. Major intracranial vascular flow voids appear stable, with a degree of intracranial artery dolichoectasia.  No restricted diffusion or evidence of acute infarction.  No midline shift, mass effect, or evidence of intracranial mass lesion. No ventriculomegaly. No acute intracranial hemorrhage identified. Wallace Cullens and White matter signal appears stable allowing for motion artifact today.  Stable paranasal sinuses and mastoids. Grossly stable orbits soft tissues. Grossly stable visualized cervical spine.  IMPRESSION: No acute intracranial abnormality identified.  Allowing for motion artifact today, stable noncontrast MRI appearance the brain since 01/26/2015.   Electronically Signed   By: Odessa Fleming M.D.   On: 01/30/2015 17:40    Scheduled Meds: . sodium chloride   Intravenous STAT  . cholecalciferol  1,000 Units Oral Daily  . famotidine  20 mg Oral BID  . feeding supplement (ENSURE ENLIVE)  237 mL Oral BID BM  . [START ON 02/01/2015] furosemide  20 mg Oral Daily  . metoprolol succinate  25 mg Oral QHS  . potassium chloride  20 mEq Oral Daily  . pravastatin  10 mg Oral QHS  . sodium chloride  3 mL Intravenous Q12H  . warfarin  2.5 mg Oral Once  . Warfarin - Pharmacist Dosing Inpatient   Does not apply Q24H   Continuous Infusions:   Active Problems:   Atrial fibrillation   Abnormality of gait   Syncope    Time spent: 30 minutes    [facility] M  [facility] Pager [phone]. If 7PM-7AM, please contact night-coverage at [email or web address], password [facility] 01/31/2015, 3:57 PM  LOS: 1 day

## 2015-01-31 NOTE — Progress Notes (Signed)
ANTICOAGULATION CONSULT NOTE - follow up  Pharmacy Consult for Coumadin (chronic Rx PTA) Indication: atrial fibrillation  Allergies  Allergen Reactions  . Penicillins Itching and Swelling   Patient Measurements: Height:  (165.1 cm) Weight: 175 lb 11.3 oz (79.7 kg) IBW/kg (Calculated) : 57  Vital Signs: Temp: 97.3 F (36.3 C) (04/06 0533) Temp Source: Oral (04/06 0533) BP: 119/69 mmHg (04/06 0533) Pulse Rate: 76 (04/06 0533)  Labs:  Recent Labs  01/30/15 1631 01/30/15 1938 01/31/15 0107 01/31/15 0715  HGB 13.9  --   --  12.1  HCT 44.2  --   --  39.1  PLT 307  --   --  275  LABPROT 22.5*  --   --  23.2*  INR 1.96*  --   --  2.04*  CREATININE 1.10  --   --  0.94  TROPONINI <0.03 <0.03 <0.03 <0.03   Estimated Creatinine Clearance: 46.5 mL/min (by C-G formula based on Cr of 0.94).  Medical History: Past Medical History  Diagnosis Date  . CHF (congestive heart failure)     Preserved EF  . A-fib   . Hypertension   . GERD (gastroesophageal reflux disease)   . COPD (chronic obstructive pulmonary disease)     O2 at night  . Sleep apnea   . CAD (coronary artery disease)     Stent to circ 2005.    . MR (congenital mitral regurgitation)     Moderate  . Stroke    Medications:  Prescriptions prior to admission  Medication Sig Dispense Refill Last Dose  . benzonatate (TESSALON) 100 MG capsule Take 1 capsule (100 mg total) by mouth 3 (three) times daily as needed for cough. 20 capsule 11 Past Month at Unknown time  . cholecalciferol (VITAMIN D) 1000 UNITS tablet Take 1,000 Units by mouth daily.   01/30/2015 at Unknown time  . furosemide (LASIX) 40 MG tablet Take 1 tablet (40 mg total) by mouth daily. Take 40 mg daily, may take an additional dose daily for edema 135 tablet 3 01/30/2015 at Unknown time  . Magnesium Hydroxide (MILK OF MAGNESIA PO) Take 30 mLs by mouth daily as needed (upset stomach).    01/29/2015 at MORNING  . meclizine (ANTIVERT) 12.5 MG tablet Take 12.5  mg by mouth 2 (two) times daily as needed. dizziness  0 Past Month at Unknown time  . metoprolol succinate (TOPROL-XL) 25 MG 24 hr tablet Take 1 tablet (25 mg total) by mouth at bedtime. 90 tablet 3 01/29/2015 at 2100  . ondansetron (ZOFRAN) 4 MG tablet Take 4 mg by mouth every 6 (six) hours as needed. nausea  0 Past Month at Unknown time  . potassium chloride (K-DUR) 10 MEQ tablet Take 10 meq daily,may take etra dose on days lasix is increased (Patient taking differently: Take 10 mEq by mouth daily. Take 10 meq daily,may take etra dose on days lasix is increased) 135 tablet 3 01/30/2015 at Unknown time  . pravastatin (PRAVACHOL) 10 MG tablet Take 1 tablet (10 mg total) by mouth at bedtime. 90 tablet 3 01/29/2015 at Unknown time  . ranitidine (ZANTAC) 150 MG tablet Take 1 tablet (150 mg total) by mouth 2 (two) times daily with a meal. 180 tablet 3 01/30/2015 at Unknown time  . spironolactone (ALDACTONE) 25 MG tablet Take 0.5 tablets (12.5 mg total) by mouth daily. 45 tablet 3 01/30/2015 at Unknown time  . warfarin (COUMADIN) 5 MG tablet Take 2.5 mg by mouth daily at 6 PM. Take 1/2  tablet daily   01/29/2015 at Unknown time   Assessment: 81yo female on chronic Coumadin PTA.  Home dose listed above.  INR slightly below goal on admission but is therapeutic today.  Records from Coumadin Clinic reviewed and noted below:  Anticoagulation Monitoring 01/24/2015  INR goal 2.0-3.0  Assoc. INR Date 01/24/2015  Associated INR 2.5  Pt. deviation No  Current weekly dose 17.5 mg  Sunday dose 2.5 mg  Monday dose 2.5 mg  Tuesday dose 2.5 mg  Wednesday dose 2.5 mg  Thursday dose 2.5 mg  Friday dose 2.5 mg  Saturday dose 2.5 mg  Weekly dose 17.5 mg  Dose description Continue coumadin 2.5mg  daily . . .  Return date 02/08/2015  VISIT REPORT    Goal of Therapy:  INR 2-3 Monitor platelets by anticoagulation protocol: Yes   Plan:  Coumadin 2.5mg  po today x 1 INR daily  Valrie HartHall, Hersh Minney A 01/31/2015,8:07 AM

## 2015-02-01 ENCOUNTER — Other Ambulatory Visit (HOSPITAL_COMMUNITY): Payer: Medicare Other

## 2015-02-01 ENCOUNTER — Inpatient Hospital Stay (HOSPITAL_COMMUNITY): Payer: Medicare Other

## 2015-02-01 ENCOUNTER — Telehealth (HOSPITAL_COMMUNITY): Payer: Self-pay | Admitting: Physical Therapy

## 2015-02-01 ENCOUNTER — Ambulatory Visit (HOSPITAL_COMMUNITY): Payer: Medicare Other | Attending: Cardiovascular Disease | Admitting: Physical Therapy

## 2015-02-01 DIAGNOSIS — R42 Dizziness and giddiness: Secondary | ICD-10-CM

## 2015-02-01 DIAGNOSIS — I5033 Acute on chronic diastolic (congestive) heart failure: Secondary | ICD-10-CM

## 2015-02-01 DIAGNOSIS — I340 Nonrheumatic mitral (valve) insufficiency: Secondary | ICD-10-CM

## 2015-02-01 LAB — PROTIME-INR
INR: 1.69 — ABNORMAL HIGH (ref 0.00–1.49)
Prothrombin Time: 20 seconds — ABNORMAL HIGH (ref 11.6–15.2)

## 2015-02-01 LAB — BASIC METABOLIC PANEL
Anion gap: 8 (ref 5–15)
BUN: 22 mg/dL (ref 6–23)
CO2: 28 mmol/L (ref 19–32)
Calcium: 9.2 mg/dL (ref 8.4–10.5)
Chloride: 103 mmol/L (ref 96–112)
Creatinine, Ser: 1.05 mg/dL (ref 0.50–1.10)
GFR calc Af Amer: 55 mL/min — ABNORMAL LOW (ref >90)
GFR, EST NON AFRICAN AMERICAN: 47 mL/min — AB (ref >90)
Glucose, Bld: 96 mg/dL (ref 70–99)
Potassium: 4.1 mmol/L (ref 3.5–5.1)
SODIUM: 139 mmol/L (ref 135–145)

## 2015-02-01 MED ORDER — ENSURE ENLIVE PO LIQD: 237.0000 mL | Freq: Two times a day (BID) | ORAL | Status: AC

## 2015-02-01 MED ORDER — FUROSEMIDE 20 MG PO TABS: 20.0000 mg | ORAL_TABLET | Freq: Every day | ORAL | Status: AC

## 2015-02-01 NOTE — Progress Notes (Addendum)
Report called to Ebbie Latusabitha Watkins at [facility].  All questions answered and she has no concerns at this time.    [facility].  Patient's IV was removed with catheter intact, no blleeding or complications.  Patient's tele monitor was also removed.  Patient left unit in stable condition in a wheelchair with a staff member and her daughter who will be providing her ride to the facility.

## 2015-02-01 NOTE — Evaluation (Signed)
Occupational Therapy Evaluation Patient Details Name: Sally White MRN: [id number] DOB: [birth year] Today's Date: 02/01/2015    History of Present Illness This is an 81 year old lady who had a syncopal event today. Approximately 3 PM today, she was in the car with her daughter and her daughter was helping her with putting her seatbelt on. At this point, she suddenly became unconscious, her body became limp and her eyes rolled to the back. This episode lasted 2-3 minutes and with no evidence of convulsions/seizure activity. The daughter tells me that prior to getting into the car, patient had been complaining feeling dizzy and lightheaded. She has never had a such a syncopal episode before. She denied any chest pain, palpitations, dyspnea prior to the event. When she regained consciousness after 2- 3 minutes, she was completely alert and orientated and recognized her daughter. There does not appear to be a post ictal confusion. She had recently seen a neurologist because she had fallen in February with nasal injury. She went to see ENT, who then referred her to neurology, thinking she may have had a stroke. A neurologist in [place], Dr. Glenice Laine, felt that she may well have had a stroke on clinical grounds and was ordering an MRI scan. Since her fall in February, she has not really walked independently. The daughter relates that she has become rather slow in her thought process and movement. The patient has been receiving home health care and the family are concerned about her ability to stay at home. She is now being admitted for further management and investigation.   Clinical Impression   PTA pt lived with daughter, pt awake, alert, and oriented x2 (person, place) this am. During evaluation, pt repeatedly stated "I don't know why I can't walk" and contributes this to fall in February. Pt unwillingly to sit at EOB this am, bed mobility not tested. Pt demonstrates good range of motion and strength  (4-/5). Pt answers questions, appears to have confusion at times during evaluation. Pt reports she can complete her ADLs independently, however daughter helps her if needed. Unable to determine pt baseline functioning at this time, as no family present during evaluation. Recommend SNF on discharge, and recommend OT evaluation at SNF to determine functioning and independence in B/IADLs and functional mobility tasks. No further acute OT needs at this time.    Follow Up Recommendations  SNF;Supervision/Assistance - 24 hour    Equipment Recommendations  None recommended by OT       Precautions / Restrictions Precautions Precautions: Fall Restrictions Weight Bearing Restrictions: No              ADL Overall ADL's : Needs assistance/impaired;At baseline Eating/Feeding: Set up                                   Functional mobility during ADLs:  (Pt reports using cane and walker) General ADL Comments: Pt reports she completes ADL tasks herself, daughter provides assistance as necessary.     Vision Vision Assessment?: Yes Eye Alignment: Within Functional Limits Ocular Range of Motion: Within Functional Limits Alignment/Gaze Preference: Within Defined Limits Tracking/Visual Pursuits: Able to track stimulus in all quads without difficulty Saccades: Within functional limits Convergence: Within functional limits Visual Fields: No apparent deficits          Pertinent Vitals/Pain Pain Assessment: No/denies pain     Hand Dominance Right   Extremity/Trunk Assessment Upper Extremity Assessment Upper Extremity Assessment:  Generalized weakness   Lower Extremity Assessment Lower Extremity Assessment: Defer to PT evaluation       Communication Communication Communication: Expressive difficulties (speech slightly slurred)   Cognition Arousal/Alertness: Awake/alert Behavior During Therapy: WFL for tasks assessed/performed Overall Cognitive Status: Difficult to  assess                                Home Living Family/patient expects to be discharged to:: Skilled nursing facility Living Arrangements: Children                                      Prior Functioning/Environment Level of Independence: Needs assistance    ADL's / Homemaking Assistance Needed: Daughter provides assistance as needed in B/IADLs        OT Diagnosis: Generalized weakness   OT Problem List: Decreased strength    End of Session    Activity Tolerance: Patient tolerated treatment well Patient left: in bed;with call bell/phone within reach;with bed alarm set   Time: [id number] OT Time Calculation (min): 15 min Charges:  OT General Charges $OT Visit: 1 Procedure OT Evaluation $Initial OT Evaluation Tier I: 1 Procedure  Ezra SitesLeslie Troxler, OTR/L  [phone]  02/01/2015, 9:41 AM

## 2015-02-01 NOTE — Progress Notes (Signed)
SUBJECTIVE: Pt complains of dizziness. Daughter says vision gradually becoming more and more impaired. Has been grateful to have time for herself to recuperate at home. Lengthy discussion with daughter who is essentially begging for her mother to be kept another day, but understands the overall predicament.      Intake/Output Summary (Last 24 hours) at 02/01/15 1106 Last data filed at 02/01/15 0856  Gross per 24 hour  Intake   1120 ml  Output   1950 ml  Net   -830 ml    Current Facility-Administered Medications  Medication Dose Route Frequency Provider Last Rate Last Dose  . cholecalciferol (VITAMIN D) tablet 1,000 Units  1,000 Units Oral Daily Wilson Singer, MD   1,000 Units at 02/01/15 1027  . famotidine (PEPCID) tablet 20 mg  20 mg Oral BID Nimish C Karilyn Cota, MD   20 mg at 02/01/15 1027  . feeding supplement (ENSURE ENLIVE) (ENSURE ENLIVE) liquid 237 mL  237 mL Oral BID BM Nimish C Gosrani, MD   237 mL at 01/31/15 1538  . furosemide (LASIX) tablet 20 mg  20 mg Oral Daily Dyann Kief, PA-C   20 mg at 02/01/15 1027  . metoprolol succinate (TOPROL-XL) 24 hr tablet 25 mg  25 mg Oral QHS Wilson Singer, MD   25 mg at 01/31/15 2207  . ondansetron (ZOFRAN) tablet 4 mg  4 mg Oral Q6H PRN Nimish Normajean Glasgow, MD       Or  . ondansetron (ZOFRAN) injection 4 mg  4 mg Intravenous Q6H PRN Nimish C Gosrani, MD      . potassium chloride SA (K-DUR,KLOR-CON) CR tablet 20 mEq  20 mEq Oral Daily Dyann Kief, PA-C   20 mEq at 02/01/15 1027  . pravastatin (PRAVACHOL) tablet 10 mg  10 mg Oral QHS Wilson Singer, MD   10 mg at 01/31/15 2207  . sodium chloride 0.9 % injection 3 mL  3 mL Intravenous Q12H Nimish C Gosrani, MD   3 mL at 02/01/15 1027  . Warfarin - Pharmacist Dosing Inpatient   Does not apply Q24H Henderson Cloud, MD        Filed Vitals:   01/31/15 1754 01/31/15 2204 02/01/15 0504 02/01/15 1010  BP: 144/74 138/62 119/68 113/61  Pulse: 86 84 89 90  Temp: 97.4  F (36.3 C) 98.1 F (36.7 C) 98.4 F (36.9 C) 98.2 F (36.8 C)  TempSrc: Oral  Oral Oral  Resp: 20 20 20 20   Height:      Weight:      SpO2: 98% 100% 96% 100%    PHYSICAL EXAM General: elderly, frail HEENT: Normal. Neck: No JVD.  Lungs: Bibasilar rales. CV: Regular rate and irregular rhythm, normal S1/S2, no S3, 2/6 pansystolic murmur along left sternal border.  No pretibial edema.  Normal pedal pulses.  Abdomen: Soft, nontender, no distention.  Neurologic: Alert, answers questions appropriately.  Musculoskeletal: No gross deformities. Extremities: No clubbing or cyanosis.   TELEMETRY: Reviewed telemetry pt in atrial fibrillation, no pauses.  LABS: Basic Metabolic Panel:  Recent Labs  16/10/96 0715 02/01/15 0627  NA 141 139  K 4.2 4.1  CL 108 103  CO2 26 28  GLUCOSE 100* 96  BUN 17 22  CREATININE 0.94 1.05  CALCIUM 9.2 9.2   Liver Function Tests:  Recent Labs  01/30/15 1631 01/31/15 0715  AST 28 21  ALT 21 16  ALKPHOS 72 56  BILITOT 1.0 1.0  PROT 7.8 6.1  ALBUMIN 4.1 3.2*   No results for input(s): LIPASE, AMYLASE in the last 72 hours. CBC:  Recent Labs  01/30/15 1631 01/31/15 0715  WBC 10.1 8.9  NEUTROABS 6.3  --   HGB 13.9 12.1  HCT 44.2 39.1  MCV 84.8 84.8  PLT 307 275   Cardiac Enzymes:  Recent Labs  01/30/15 1938 01/31/15 0107 01/31/15 0715  TROPONINI <0.03 <0.03 <0.03   BNP: Invalid input(s): POCBNP D-Dimer: No results for input(s): DDIMER in the last 72 hours. Hemoglobin A1C: No results for input(s): HGBA1C in the last 72 hours. Fasting Lipid Panel: No results for input(s): CHOL, HDL, LDLCALC, TRIG, CHOLHDL, LDLDIRECT in the last 72 hours. Thyroid Function Tests:  Recent Labs  01/30/15 1631  TSH 2.840   Anemia Panel: No results for input(s): VITAMINB12, FOLATE, FERRITIN, TIBC, IRON, RETICCTPCT in the last 72 hours.  RADIOLOGY: Dg Chest 2 View  01/30/2015   CLINICAL DATA:  Syncope and dizziness  EXAM: CHEST  2 VIEW   COMPARISON:  January 26, 2015  FINDINGS: There is no edema or consolidation. Heart is upper normal in size. The pulmonary vascularity is within normal limits. No adenopathy. There is atherosclerotic change in the aorta. No bone lesions.  IMPRESSION: No edema or consolidation.  No change in cardiac silhouette.   Electronically Signed   By: Bretta Bang III M.D.   On: 01/30/2015 17:59   Dg Chest 2 View  01/26/2015   CLINICAL DATA:  Weakness and shortness of breath.  EXAM: CHEST  2 VIEW  COMPARISON:  01/04/2015 and 11/26/2014  FINDINGS: Heart size and pulmonary vascularity are normal considering the AP portable technique. The lungs are clear. No effusions. No acute osseous abnormality. Slight tortuosity and calcification of the thoracic aorta.  IMPRESSION: No acute abnormalities.   Electronically Signed   By: Francene Boyers M.D.   On: 01/26/2015 12:43   Ct Head Wo Contrast  01/26/2015   CLINICAL DATA:  Fall in February 2016, RA core and dizziness  EXAM: CT HEAD WITHOUT CONTRAST  TECHNIQUE: Contiguous axial images were obtained from the base of the skull through the vertex without intravenous contrast.  COMPARISON:  01/04/2015  FINDINGS: No skull fracture is noted. Again noted nondisplaced fracture of the right nasal bone.  No intracranial hemorrhage, mass effect or midline shift. Paranasal sinuses and mastoid air cells are unremarkable. Atherosclerotic calcifications of basilar artery and left vertebral artery.  Stable cerebral atrophy. Stable periventricular and patchy subcortical chronic white matter disease. No definite acute cortical infarction. No mass lesion is noted on this unenhanced scan.  IMPRESSION: No acute intracranial abnormality. Stable atrophy and chronic white matter disease.   Electronically Signed   By: Natasha Mead M.D.   On: 01/26/2015 12:32   Ct Pelvis Wo Contrast  01/26/2015   CLINICAL DATA:  Limp since February and concern for fracture. Reported outside radiographs negative for fracture.  Initial encounter.  EXAM: CT PELVIS WITHOUT CONTRAST  TECHNIQUE: Multidetector CT imaging of the pelvis was performed following the standard protocol without intravenous contrast.  COMPARISON:  None.  FINDINGS: There is no evidence of pelvic ring fracture or diastasis. The hips are located and intact. Although MRI is the preferred method for detecting occult fracture, since the patient's symptoms have been present since February, would expect some CT manifestation.  Osteopenia. There is advanced degenerative disc narrowing at L4-5 and L5-S1, with foraminal crowding. 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  No acute findings  in the pelvis.  An immediately subcutaneous nodule in the left groin measures 21 mm. Location and smooth rounded appearance favors a dermal inclusion cyst rather than adenopathy.  IMPRESSION: 1. No acute osseous findings. 2. 2 cm mass in the left groin, likely a benign dermal inclusion cyst. Please confirm with exam.   Electronically Signed   By: Marnee Spring M.D.   On: 01/26/2015 15:46   Mr Brain Wo Contrast  01/30/2015   CLINICAL DATA:  81 year old female with weakness and altered mental status x1 day. Agitation. Loss of consciousness. On Coumadin. Initial encounter.  EXAM: MRI HEAD WITHOUT CONTRAST  TECHNIQUE: Multiplanar, multiecho pulse sequences of the brain and surrounding structures were obtained without intravenous contrast.  COMPARISON:  Brain MRI 01/26/2015 and earlier.  FINDINGS: Today study is more degraded by motion than that 4 days ago. Stable cerebral volume. Major intracranial vascular flow voids appear stable, with a degree of intracranial artery dolichoectasia.  No restricted diffusion or evidence of acute infarction.  No midline shift, mass effect, or evidence of intracranial mass lesion. No ventriculomegaly. No acute intracranial hemorrhage identified. Wallace Cullens and white matter signal appears stable allowing for motion artifact today.  Stable paranasal  sinuses and mastoids. Grossly stable orbits soft tissues. Grossly stable visualized cervical spine.  IMPRESSION: No acute intracranial abnormality identified.  Allowing for motion artifact today, stable noncontrast MRI appearance the brain since 01/26/2015.   Electronically Signed   By: Odessa Fleming M.D.   On: 01/30/2015 17:40   Mr Brain Wo Contrast  01/26/2015   CLINICAL DATA:  Weakness.  Recent falls.  Atrial fibrillation.  EXAM: MRI HEAD WITHOUT CONTRAST  TECHNIQUE: Multiplanar, multiecho pulse sequences of the brain and surrounding structures were obtained without intravenous contrast.  COMPARISON:  CT head 10/2014  FINDINGS: Negative for acute infarct.  Ventricle size normal.  Cerebral volume normal for age.  Mild chronic microvascular ischemic change in the white matter. Basal ganglia and brainstem normal. Cerebellum negative.  Negative for intracranial hemorrhage.  No subdural fluid collection  Negative for mass or edema.  No shift of the midline structures.  Air-fluid level left sphenoid sinus.  IMPRESSION: No acute abnormality. Age appropriate atrophy and chronic microvascular ischemic change.  Air-fluid level left sphenoid sinus.   Electronically Signed   By: Marlan Palau M.D.   On: 01/26/2015 13:25   US Carotid Bilateral  01/31/2015   CLINICAL DATA:  Syncope and history of hypertension, coronary artery disease and hyperlipidemia.  EXAM: BILATERAL CAROTID DUPLEX ULTRASOUND  TECHNIQUE: Wallace Cullens scale imaging, color Doppler and duplex ultrasound were performed of bilateral carotid and vertebral arteries in the neck.  COMPARISON:  None.  FINDINGS: Criteria: Quantification of carotid stenosis is based on velocity parameters that correlate the residual internal carotid diameter with NASCET-based stenosis levels, using the diameter of the distal internal carotid lumen as the denominator for stenosis measurement.  The following velocity measurements were obtained:  RIGHT  ICA:  58/12 cm/sec  CCA:  62/13 cm/sec   SYSTOLIC ICA/CCA RATIO:  0.9  DIASTOLIC ICA/CCA RATIO:  0.9  ECA:  67 cm/sec  LEFT  ICA:  60/15 cm/sec  CCA:  62/13 cm/sec  SYSTOLIC ICA/CCA RATIO:  1.0  DIASTOLIC ICA/CCA RATIO:  1.2  ECA:  64 cm/sec  RIGHT CAROTID ARTERY: There is a mild amount of predominately calcified plaque at the level of the carotid bulb and extending into the proximal internal carotid artery. Estimated right ICA stenosis is less than 50%.  RIGHT VERTEBRAL ARTERY: Antegrade  flow with normal waveform and velocity.  LEFT CAROTID ARTERY: There is a minimal amount of partially calcified plaque at the level of the carotid bulb and proximal ICA. Estimated left ICA stenosis is less than 50%.  LEFT VERTEBRAL ARTERY: Antegrade flow with normal waveform and velocity.  IMPRESSION: Mild amount of plaque at the level of both carotid bulbs and proximal internal carotid arteries, right greater than left. No significant carotid stenosis is identified with estimated bilateral ICA stenoses of less than 50%.   Electronically Signed   By: Irish LackGlenn  Yamagata M.D.   On: 01/31/2015 17:25      ASSESSMENT AND PLAN: 1. Syncope: Uncertain etiology, possibly vagally mediated. Was orthostatic yesterday, so Lasix was decreased and spironolactone was stopped. Has chronic atrial fibrillation, but controlled rate and no evidence of brady/tachycardia syndrome. No pauses on telemetry overnight. MRI negative.  CAD: S/P Stent CFX 2005, patent cath 2007, negative nuclear stress 2014-[place] Troponins negative no symptoms. In order to rule out bradyarrhythmias and/or pauses as a potential etiology for syncope, I recommend placing a 30-day event monitor at the time of discharge and if this were unrevealing, I would recommend an implantable loop recorder.  2. Chronic atrial fibrillation:Controlled rate on Coumadin and Toprol.  3. Acute on chronic diastolic CHF: Will obtain chest xray. IV fluids d/c yesterday. Lasix dose reduced to 20 mg and spironolactone stopped yesterday  due to orthostatic hypotension. Given moderate to severe MR with low normal LV systolic function and elevated LV/LA pressures, may have to increase Lasix dose back to 40 mg.  4. Valvular heart disease: Obtain CXR to evaluate for CHF, given moderate to severe MR, EF 50%, with high left-sided filling pressures.  Dispo: Discussed management plan with daughter, [name]. With regards to her living situation, it has become increasingly difficult for her daughter to take care of her at home as the patient is unable to do anything independently as she used to. I feel assisted living may be her best option but short hospital stay may be prohibitive.   Prentice DockerSuresh Nonna Renninger, M.D., F.A.C.C.

## 2015-02-01 NOTE — Progress Notes (Signed)
Physical Therapy Treatment Patient Details Name: Ivin PootMyrtle Halsted MRN: [id number] DOB: 03/11/1928 Today's Date: 02/01/2015    History of Present Illness This is an 81 year old lady who had a syncopal event today. Approximately 3 PM today, she was in the car with her daughter and her daughter was helping her with putting her seatbelt on. At this point, she suddenly became unconscious, her body became limp and her eyes rolled to the back. This episode lasted 2-3 minutes and with no evidence of convulsions/seizure activity. The daughter tells me that prior to getting into the car, patient had been complaining feeling dizzy and lightheaded. She has never had a such a syncopal episode before. She denied any chest pain, palpitations, dyspnea prior to the event. When she regained consciousness after 2- 3 minutes, she was completely alert and orientated and recognized her daughter. There does not appear to be a post ictal confusion. She had recently seen a neurologist because she had fallen in February with nasal injury. She went to see ENT, who then referred her to neurology, thinking she may have had a stroke. A neurologist in [place], Dr. Glenice Laineomeiher, felt that she may well have had a stroke on clinical grounds and was ordering an MRI scan. Since her fall in February, she has not really walked independently. The daughter relates that she has become rather slow in her thought process and movement. The patient has been receiving home health care and the family are concerned about her ability to stay at home. She is now being admitted for further management and investigation.    PT Comments    Pt reports continuing lightheadedness in all positions all of the time.  Her strength continues to be WNL and she was able to participate in therapeutic exercise with no undue fatigue.  She continues to want bilateral hand held assist with sitting and with standing she falls backward when standing with a walker.  When she does a  pivot transfer holding onto therapist she does not fall backward at all.  There may be a fairly significant emotional component to pt's problems.  She acknowledges severe fear of falling and is missing her prior independence that she had before moving here from [place].  Follow Up Recommendations  Home health PT     Equipment Recommendations    none   Recommendations for Other Services  none     Precautions / Restrictions Precautions Precautions: Fall Restrictions Weight Bearing Restrictions: No    Mobility  Bed Mobility Overal bed mobility: Needs Assistance Bed Mobility: Supine to Sit     Supine to sit: Supervision     General bed mobility comments: pt immediately reaches for the bedrail to maintain seated position and needed assist to reach it  Transfers Overall transfer level: Needs assistance Equipment used: Rolling walker (2 wheeled) Transfers: Sit to/from [employer];Stand Pivot Transfers Sit to Stand: Mod assist Stand pivot transfers: Mod assist       General transfer comment: pt needs cues to push up from bed or chair instead of grabbling for walker...she immediately falls backward on standing when standing with the walker and max assist needed to maintain standing...when doing a pivot transfer with therapist assist she does not fall backward, probably because she feels more secure  Ambulation/Gait Ambulation/Gait assistance:  (unable)               Stairs            Wheelchair Mobility    Modified Rankin (Stroke Patients Only)  Balance     Sitting balance-Leahy Scale: Fair       Standing balance-Leahy Scale: Poor                      Cognition Arousal/Alertness: Awake/alert Behavior During Therapy: WFL for tasks assessed/performed Overall Cognitive Status: Within Functional Limits for tasks assessed       Memory: Decreased short-term memory              Exercises General Exercises - Lower Extremity Ankle  Circles/Pumps: AROM;Both;10 reps;Supine Heel Slides: AROM;Both;10 reps;Supine Other Exercises Other Exercises: sit to stand x 5 repetitions    General Comments        Pertinent Vitals/Pain Pain Assessment: No/denies pain    Home Living Family/patient expects to be discharged to:: Skilled nursing facility Living Arrangements: Children                  Prior Function Level of Independence: Needs assistance    ADL's / Homemaking Assistance Needed: Daughter provides assistance as needed in B/IADLs     PT Goals (current goals can now be found in the care plan section) Progress towards PT goals: Progressing toward goals    Frequency  Min 3X/week    PT Plan Discharge plan needs to be updated    Co-evaluation             End of Session Equipment Utilized During Treatment: Gait belt Activity Tolerance: Patient limited by fatigue Patient left: in chair;with call bell/phone within reach;with chair alarm set     Time: 1610-9604 PT Time Calculation (min) (ACUTE ONLY): 36 min  Charges:  $Therapeutic Exercise: 8-22 mins $Therapeutic Activity: 8-22 mins                    G Codes:      Konrad Penta 02/18/2015, 11:19 AM

## 2015-02-01 NOTE — Discharge Summary (Signed)
Physician Discharge Summary  Sally White ZOX:[id number] DOB: Dec 04, 1927 DOA: 01/30/2015  PCP: Ignatius Specking., MD  Admit date: 01/30/2015 Discharge date: 02/01/2015  Time spent: 40 minutes  Recommendations for Outpatient Follow-up:  1. PCP 1 week for evaluation of dizziness. 2. Follow up with cardiology for event monitor 3. Being discharged to [facility]   Discharge Diagnoses:  Active Problems:   Atrial fibrillation   Abnormality of gait   Syncope   Discharge Condition: stable  Diet recommendation: heart healthy  Filed Weights   01/30/15 1621 01/30/15 2022  Weight: 79.379 kg (175 lb) 79.7 kg (175 lb 11.3 oz)    History of present illness:  Sally White is a 82 y.o. female  who had a syncopal event 01/30/15. Approximately 3 PM  she was in the car with her daughter and her daughter was helping her with putting her seatbelt on. At that point, she suddenly became unconscious, her body became limp and her eyes rolled to the back. This episode lasted 2-3 minutes and with no evidence of convulsions/seizure activity. The daughter reported that prior to getting into the car, patient had been complaining feeling dizzy and lightheaded. She had never had  such a syncopal episode before. She denied any chest pain, palpitations, dyspnea prior to the event. When she regained consciousness after 2- 3 minutes, she was completely alert and orientated and recognized her daughter. There did not appear to be a post ictal confusion. She had recently seen a neurologist because she had fallen in February with nasal injury. She went to see ENT, who then referred her to neurology, thinking she may have had a stroke. A neurologist in [place], Dr. Glenice Laine, felt that she may well have had a stroke on clinical grounds and was ordering an MRI scan. Since her fall in February, she had not really walked independently. The daughter related that she had become rather slow in her thought process and  movement. The patient had been receiving home health care and the family are concerned about her ability to stay at home.   Hospital Course:   Syncope. Etiology is unclear. MRI brain scan is entirely normal. Echocardiogram EF 50%, no events on tele, orthtostatic from lying to sitting so lasix decreased and spironolactone discontinued. No further episodes but complained "swimmy headed".  Evaluated by cardiology who who recommend event monitor at discharge. Evaluated by neurology who opine may be vasovagal phenomenon and seizures unlikely.   Atrial fibrillation.  Cardiology recommending to continue coumadin for now. Rate controlled  Debility. Worsening since fall in feb per chart review. TSH within the limits of normal. Pt recommending snf. Patient will be discharged to [facility]  Procedures:  Echo 01/31/15 There was mild concentric hypertrophy. Systolic function was low normal. The estimated ejection fraction was approximately 50%. The study was not technically sufficient to allow evaluation of LV diastolic dysfunction due to atrial fibrillation. Doppler parameters are consistent with both elevated ventricular end-diastolic filling pressure and elevated left atrial filling pressure.   Consultations: Cardiology neurology  Discharge Exam: Filed Vitals:   02/01/15 1010  BP: 113/61  Pulse: 90  Temp: 98.2 F (36.8 C)  Resp: 20    General: well nourished  Cardiovascular: irregularly irregular +murmur  Respiratory: normal effort BS clear bilaterally no wheeze  Discharge Instructions    Current Discharge Medication List    START taking these medications   Details  feeding supplement, ENSURE ENLIVE, (ENSURE ENLIVE) LIQD Take 237 mLs by mouth  2 (two) times daily between meals. Qty: 237 mL, Refills: 12      CONTINUE these medications which have CHANGED   Details  furosemide (LASIX) 20 MG tablet Take 1 tablet (20 mg total) by mouth daily. Qty: 30  tablet, Refills: 0      CONTINUE these medications which have NOT CHANGED   Details  benzonatate (TESSALON) 100 MG capsule Take 1 capsule (100 mg total) by mouth 3 (three) times daily as needed for cough. Qty: 20 capsule, Refills: 11    cholecalciferol (VITAMIN D) 1000 UNITS tablet Take 1,000 Units by mouth daily.    Magnesium Hydroxide (MILK OF MAGNESIA PO) Take 30 mLs by mouth daily as needed (upset stomach).     meclizine (ANTIVERT) 12.5 MG tablet Take 12.5 mg by mouth 2 (two) times daily as needed. dizziness Refills: 0    metoprolol succinate (TOPROL-XL) 25 MG 24 hr tablet Take 1 tablet (25 mg total) by mouth at bedtime. Qty: 90 tablet, Refills: 3    ondansetron (ZOFRAN) 4 MG tablet Take 4 mg by mouth every 6 (six) hours as needed. nausea Refills: 0    potassium chloride (K-DUR) 10 MEQ tablet Take 10 meq daily,may take etra dose on days lasix is increased Qty: 135 tablet, Refills: 3    pravastatin (PRAVACHOL) 10 MG tablet Take 1 tablet (10 mg total) by mouth at bedtime. Qty: 90 tablet, Refills: 3    ranitidine (ZANTAC) 150 MG tablet Take 1 tablet (150 mg total) by mouth 2 (two) times daily with a meal. Qty: 180 tablet, Refills: 3    spironolactone (ALDACTONE) 25 MG tablet Take 0.5 tablets (12.5 mg total) by mouth daily. Qty: 45 tablet, Refills: 3    warfarin (COUMADIN) 5 MG tablet Take 2.5 mg by mouth daily at 6 PM. Take 1/2 tablet daily       Allergies  Allergen Reactions  . Penicillins Itching and Swelling   Follow-up Information    Follow up with VYAS,DHRUV B., MD. Schedule an appointment as soon as possible for a visit in 1 week.   Specialty:  Internal Medicine   Contact information:   [address] [place] [address] [phone]        The results of significant diagnostics from this hospitalization (including imaging, microbiology, ancillary and laboratory) are listed below for reference.    Significant Diagnostic Studies: Dg Chest 2 View  01/30/2015    CLINICAL DATA:  Syncope and dizziness  EXAM: CHEST  2 VIEW  COMPARISON:  January 26, 2015  FINDINGS: There is no edema or consolidation. Heart is upper normal in size. The pulmonary vascularity is within normal limits. No adenopathy. There is atherosclerotic change in the aorta. No bone lesions.  IMPRESSION: No edema or consolidation.  No change in cardiac silhouette.   Electronically Signed   By: Bretta BangWilliam  Woodruff III M.D.   On: 01/30/2015 17:59   Dg Chest 2 View  01/26/2015   CLINICAL DATA:  Weakness and shortness of breath.  EXAM: CHEST  2 VIEW  COMPARISON:  01/04/2015 and 11/26/2014  FINDINGS: Heart size and pulmonary vascularity are normal considering the AP portable technique. The lungs are clear. No effusions. No acute osseous abnormality. Slight tortuosity and calcification of the thoracic aorta.  IMPRESSION: No acute abnormalities.   Electronically Signed   By: Francene BoyersJames  Maxwell M.D.   On: 01/26/2015 12:43   Ct Head Wo Contrast  01/26/2015   CLINICAL DATA:  Fall in February 2016, RA core and dizziness  EXAM: CT HEAD WITHOUT CONTRAST  TECHNIQUE: Contiguous axial images were obtained from the base of the skull through the vertex without intravenous contrast.  COMPARISON:  01/04/2015  FINDINGS: No skull fracture is noted. Again noted nondisplaced fracture of the right nasal bone.  No intracranial hemorrhage, mass effect or midline shift. Paranasal sinuses and mastoid air cells are unremarkable. Atherosclerotic calcifications of basilar artery and left vertebral artery.  Stable cerebral atrophy. Stable periventricular and patchy subcortical chronic white matter disease. No definite acute cortical infarction. No mass lesion is noted on this unenhanced scan.  IMPRESSION: No acute intracranial abnormality. Stable atrophy and chronic white matter disease.   Electronically Signed   By: Natasha Mead M.D.   On: 01/26/2015 12:32   Ct Pelvis Wo Contrast  01/26/2015   CLINICAL DATA:  Limp since February and concern for  fracture. Reported outside radiographs negative for fracture. Initial encounter.  EXAM: CT PELVIS WITHOUT CONTRAST  TECHNIQUE: Multidetector CT imaging of the pelvis was performed following the standard protocol without intravenous contrast.  COMPARISON:  None.  FINDINGS: There is no evidence of pelvic ring fracture or diastasis. The hips are located and intact. Although MRI is the preferred method for detecting occult fracture, since the patient's symptoms have been present since February, would expect some CT manifestation.  Osteopenia. There is advanced degenerative disc narrowing at L4-5 and L5-S1, with foraminal crowding. 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  No acute findings in the pelvis.  An immediately subcutaneous nodule in the left groin measures 21 mm. Location and smooth rounded appearance favors a dermal inclusion cyst rather than adenopathy.  IMPRESSION: 1. No acute osseous findings. 2. 2 cm mass in the left groin, likely a benign dermal inclusion cyst. Please confirm with exam.   Electronically Signed   By: Marnee Spring M.D.   On: 01/26/2015 15:46   Mr Brain Wo Contrast  01/30/2015   CLINICAL DATA:  81 year old female with weakness and altered mental status x1 day. Agitation. Loss of consciousness. On Coumadin. Initial encounter.  EXAM: MRI HEAD WITHOUT CONTRAST  TECHNIQUE: Multiplanar, multiecho pulse sequences of the brain and surrounding structures were obtained without intravenous contrast.  COMPARISON:  Brain MRI 01/26/2015 and earlier.  FINDINGS: Today study is more degraded by motion than that 4 days ago. Stable cerebral volume. Major intracranial vascular flow voids appear stable, with a degree of intracranial artery dolichoectasia.  No restricted diffusion or evidence of acute infarction.  No midline shift, mass effect, or evidence of intracranial mass lesion. No ventriculomegaly. No acute intracranial hemorrhage identified. Wallace Cullens and white matter signal appears  stable allowing for motion artifact today.  Stable paranasal sinuses and mastoids. Grossly stable orbits soft tissues. Grossly stable visualized cervical spine.  IMPRESSION: No acute intracranial abnormality identified.  Allowing for motion artifact today, stable noncontrast MRI appearance the brain since 01/26/2015.   Electronically Signed   By: Odessa Fleming M.D.   On: 01/30/2015 17:40   Mr Brain Wo Contrast  01/26/2015   CLINICAL DATA:  Weakness.  Recent falls.  Atrial fibrillation.  EXAM: MRI HEAD WITHOUT CONTRAST  TECHNIQUE: Multiplanar, multiecho pulse sequences of the brain and surrounding structures were obtained without intravenous contrast.  COMPARISON:  CT head 10/2014  FINDINGS: Negative for acute infarct.  Ventricle size normal.  Cerebral volume normal for age.  Mild chronic microvascular ischemic change in the white matter. Basal ganglia and brainstem normal. Cerebellum negative.  Negative for intracranial hemorrhage.  No subdural fluid collection  Negative for mass or edema.  No shift of the midline structures.  Air-fluid level left sphenoid sinus.  IMPRESSION: No acute abnormality. Age appropriate atrophy and chronic microvascular ischemic change.  Air-fluid level left sphenoid sinus.   Electronically Signed   By: Marlan Palau M.D.   On: 01/26/2015 13:25   US Carotid Bilateral  01/31/2015   CLINICAL DATA:  Syncope and history of hypertension, coronary artery disease and hyperlipidemia.  EXAM: BILATERAL CAROTID DUPLEX ULTRASOUND  TECHNIQUE: Wallace Cullens scale imaging, color Doppler and duplex ultrasound were performed of bilateral carotid and vertebral arteries in the neck.  COMPARISON:  None.  FINDINGS: Criteria: Quantification of carotid stenosis is based on velocity parameters that correlate the residual internal carotid diameter with NASCET-based stenosis levels, using the diameter of the distal internal carotid lumen as the denominator for stenosis measurement.  The following velocity measurements were  obtained:  RIGHT  ICA:  58/12 cm/sec  CCA:  62/13 cm/sec  SYSTOLIC ICA/CCA RATIO:  0.9  DIASTOLIC ICA/CCA RATIO:  0.9  ECA:  67 cm/sec  LEFT  ICA:  60/15 cm/sec  CCA:  62/13 cm/sec  SYSTOLIC ICA/CCA RATIO:  1.0  DIASTOLIC ICA/CCA RATIO:  1.2  ECA:  64 cm/sec  RIGHT CAROTID ARTERY: There is a mild amount of predominately calcified plaque at the level of the carotid bulb and extending into the proximal internal carotid artery. Estimated right ICA stenosis is less than 50%.  RIGHT VERTEBRAL ARTERY: Antegrade flow with normal waveform and velocity.  LEFT CAROTID ARTERY: There is a minimal amount of partially calcified plaque at the level of the carotid bulb and proximal ICA. Estimated left ICA stenosis is less than 50%.  LEFT VERTEBRAL ARTERY: Antegrade flow with normal waveform and velocity.  IMPRESSION: Mild amount of plaque at the level of both carotid bulbs and proximal internal carotid arteries, right greater than left. No significant carotid stenosis is identified with estimated bilateral ICA stenoses of less than 50%.   Electronically Signed   By: Irish Lack M.D.   On: 01/31/2015 17:25    Microbiology: No results found for this or any previous visit (from the past 240 hour(s)).   Labs: Basic Metabolic Panel:  Recent Labs Lab 01/26/15 1215 01/30/15 1631 01/31/15 0715 02/01/15 0627  NA 141 139 141 139  K 4.9 4.2 4.2 4.1  CL 108 102 108 103  CO2 GLUCOSE 97 110* 100* 96  BUN CREATININE 1.19* 1.10 0.94 1.05  CALCIUM 9.8 9.8 9.2 9.2   Liver Function Tests:  Recent Labs Lab 01/30/15 1631 01/31/15 0715  AST 28 21  ALT 21 16  ALKPHOS 72 56  BILITOT 1.0 1.0  PROT 7.8 6.1  ALBUMIN 4.1 3.2*   No results for input(s): LIPASE, AMYLASE in the last 168 hours. No results for input(s): AMMONIA in the last 168 hours. CBC:  Recent Labs Lab 01/26/15 1215 01/30/15 1631 01/31/15 0715  WBC 8.1 10.1 8.9  NEUTROABS  --  6.3  --   HGB 13.8 13.9 12.1  HCT 43.9  44.2 39.1  MCV 83.5 84.8 84.8  PLT 252 307 275   Cardiac Enzymes:  Recent Labs Lab 01/30/15 1631 01/30/15 1938 01/31/15 0107 01/31/15 0715  TROPONINI <0.03 <0.03 <0.03 <0.03   BNP: BNP (last 3 results)  Recent Labs  01/30/15 1631  BNP 433.0*    ProBNP (last 3 results) No results for input(s): PROBNP in the last 8760 hours.  CBG: No  results for input(s): GLUCAP in the last 168 hours.     SignedGwenyth Bender  [facility] 02/01/2015, 11:04 AM

## 2015-02-01 NOTE — Clinical Social Work Note (Signed)
Pt stable for d/c today. CSW and CM discussed this with pt's daughter who is aware pt will not have qualifying stay for SNF. Daughter indicates they cannot afford private pay. CSW and CM discussed possibility of home health, private duty care, and equipment needs. Daughter reports that she has all equipment and does not want home health. She was interested in private duty care and CM to provide more information. She also said that [facility] which will provide some relief for her. Daughter states that pt will not qualify for Medicaid as she has already looked into this due to property that she owns. CSW notified [place] that pt would be returning home.   Derenda FennelKara Mirabella Hilario, [place] [phone]

## 2015-02-01 NOTE — Clinical Social Work Placement (Signed)
Clinical Social Work Department CLINICAL SOCIAL WORK PLACEMENT NOTE 02/01/2015  Patient:  Sally White,Sally White  Account Number:  [id number] Admit date:  01/30/2015  Clinical Social Worker:  Derenda FennelKARA Gian Ybarra, LCSW  Date/time:  01/31/2015 11:37 AM  Clinical Social Work is seeking post-discharge placement for this patient at the following level of care:   SKILLED NURSING   (*CSW will update this form in Epic as items are completed)   01/31/2015  Patient/family provided with [facility] Department of Clinical Social Work's list of facilities offering this level of care within the geographic area requested by the patient (or if unable, by the patient's family).  01/31/2015  Patient/family informed of their freedom to choose among providers that offer the needed level of care, that participate in Medicare, Medicaid or managed care program needed by the patient, have an available bed and are willing to accept the patient.  01/31/2015  Patient/family informed of [facility]' ownership interest in [facility], as well as of the fact that they are under no obligation to receive care at this facility.  PASARR submitted to EDS on  PASARR number received on   FL2 transmitted to all facilities in geographic area requested by pt/family on  01/31/2015 FL2 transmitted to all facilities within larger geographic area on   Patient informed that his/her managed care company has contracts with or will negotiate with  certain facilities, including the following:     Patient/family informed of bed offers received:  02/01/2015 Patient chooses bed at Other Physician recommends and patient chooses bed at    Patient to be transferred to Other on  02/01/2015 Patient to be transferred to facility by daughter Patient and family notified of transfer on 02/01/2015 Name of family member notified:  Derenda- daughter  The following physician request were entered in Epic:   Additional Comments: No pasarr needed  per [place] facilities. [facility]  [place]ara Violett Hob[place], [place] [phone]

## 2015-02-01 NOTE — Telephone Encounter (Signed)
Called to check on pt at 10:54 am b/c she missed her apptment. Person (lady) answered the phone said pt was in the hospital and hung up on me.

## 2015-02-01 NOTE — Clinical Social Work Note (Signed)
Pt's daughter now feels that they will have to do whatever necessary so she can have a break and wants SNF. [facility] will [facility] private pay and daughter agreeable. Pt also aware and agreeable. CSW will fax d/c summary and daughter to provide transport.   Derenda FennelKara Pablo Stauffer, [place] [phone]

## 2015-02-08 ENCOUNTER — Ambulatory Visit (INDEPENDENT_AMBULATORY_CARE_PROVIDER_SITE_OTHER): Payer: Medicare Other | Admitting: *Deleted

## 2015-02-08 DIAGNOSIS — I480 Paroxysmal atrial fibrillation: Secondary | ICD-10-CM

## 2015-02-08 DIAGNOSIS — Z5181 Encounter for therapeutic drug level monitoring: Secondary | ICD-10-CM

## 2015-02-08 LAB — POCT INR: INR: 1.9

## 2015-02-20 ENCOUNTER — Ambulatory Visit: Payer: Medicare Other | Admitting: Cardiovascular Disease

## 2015-09-27 DEATH — deceased

## 2016-06-04 IMAGING — DX DG CHEST 2V
2 series · 2 of 2 positions shown · non-contrast
Comparison: 01/04/2015 and 11/26/2014

CLINICAL DATA: Weakness and shortness of breath.

EXAM:
CHEST  2 VIEW

[chest lat]
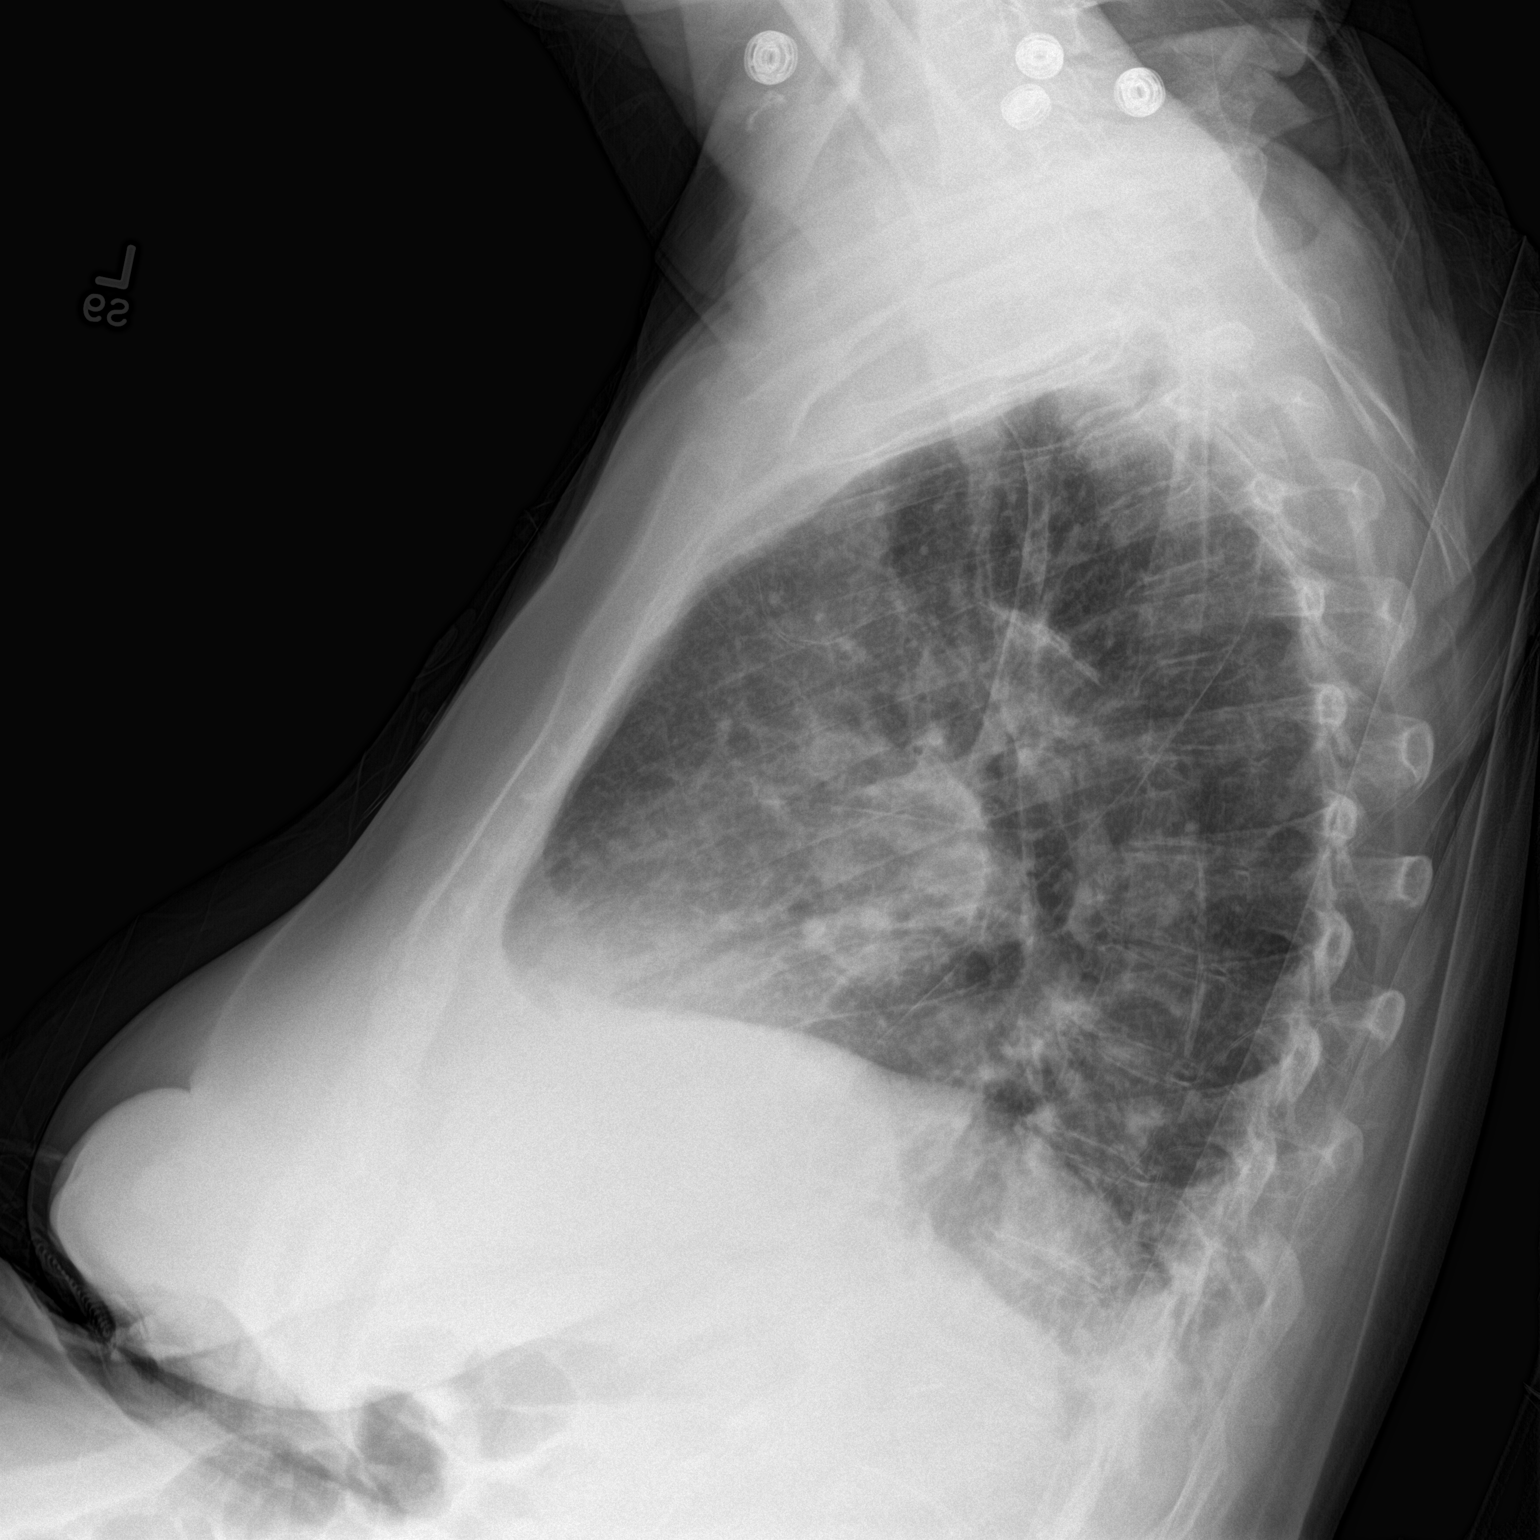

[chest ap]
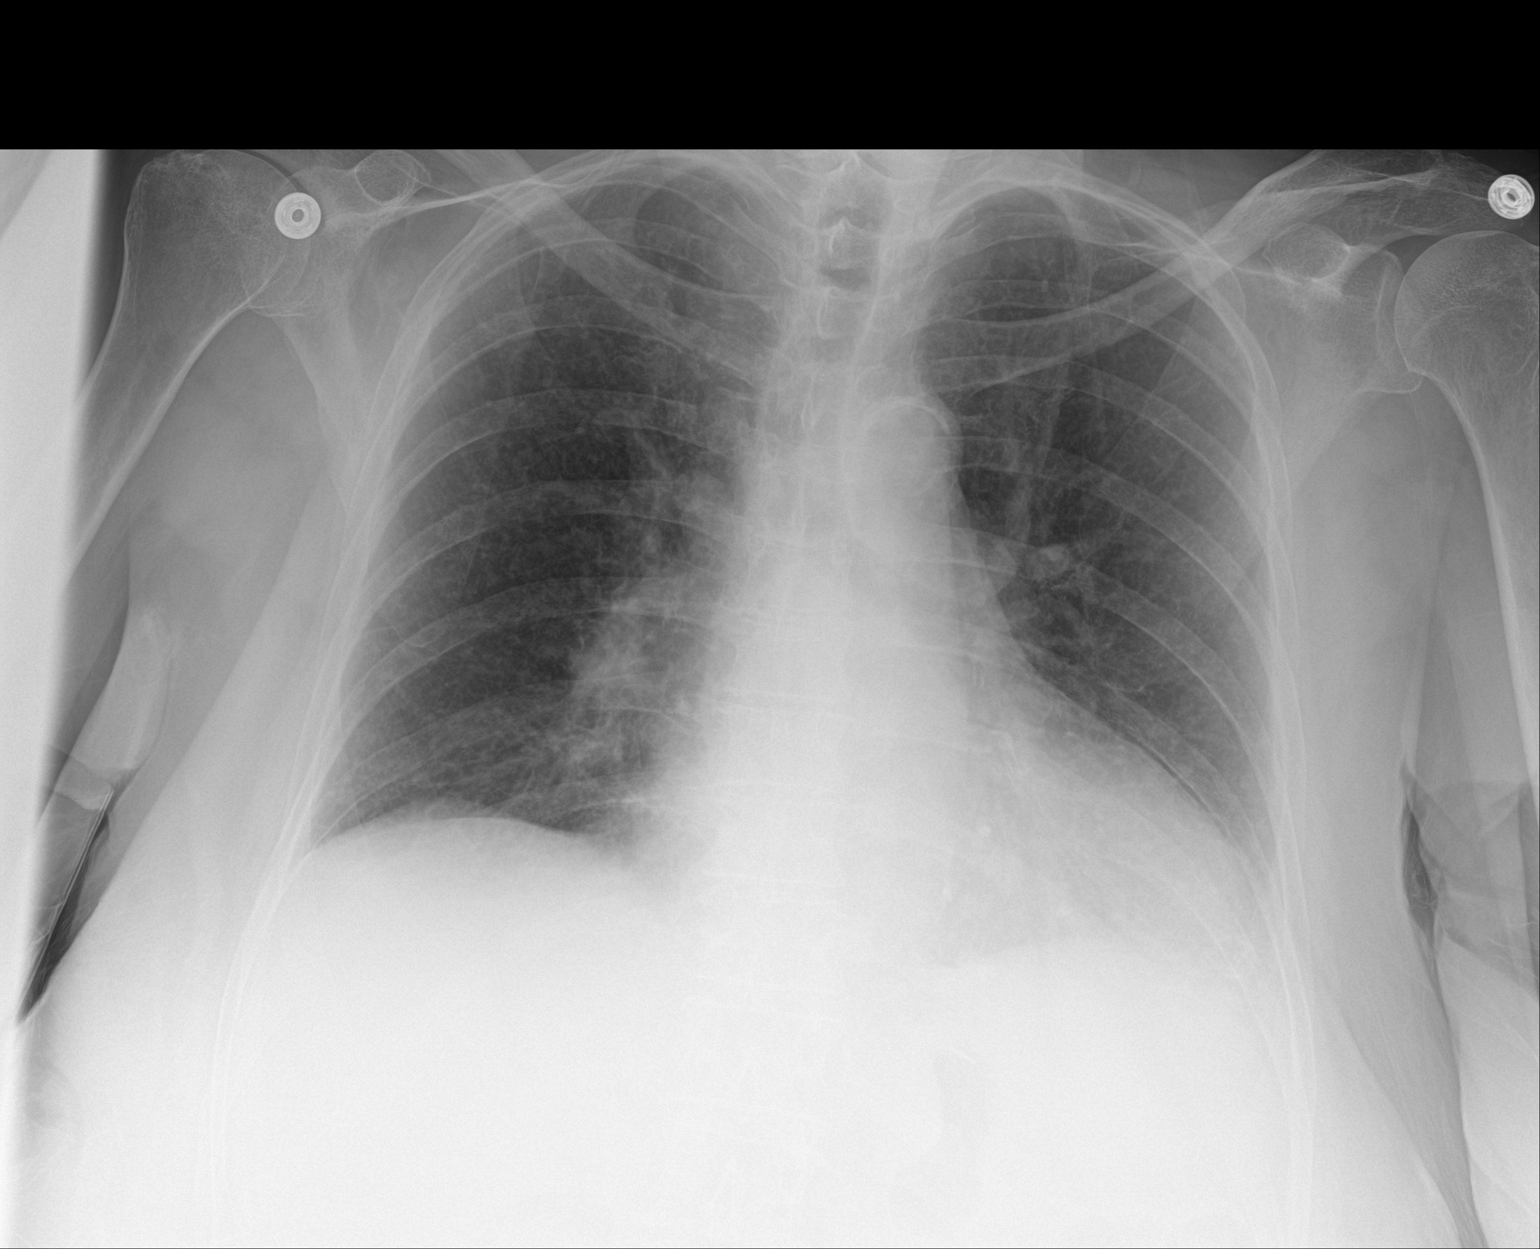

[2 of 2 positions shown; findings below may reference images not displayed]

FINDINGS: Heart size and pulmonary vascularity are normal considering the AP
portable technique. The lungs are clear. No effusions. No acute
osseous abnormality. Slight tortuosity and calcification of the
thoracic aorta.
IMPRESSION: No acute abnormalities.

## 2016-06-08 IMAGING — MR MR HEAD W/O CM
11 series · 42 of 48 positions shown · non-contrast
Comparison: Brain MRI 01/26/2015 and earlier.

CLINICAL DATA: 89-year-old female with weakness and altered mental
status x1 day. Agitation. Loss of consciousness. On Coumadin.
Initial encounter.

EXAM:
MRI HEAD WITHOUT CONTRAST
TECHNIQUE: Multiplanar, multiecho pulse sequences of the brain and surrounding
structures were obtained without intravenous contrast.

[Series 2: [id number] · sagittal · 5.0mm · 0.45mm/px · 1 of 20 slices shown]
[im 1/20]
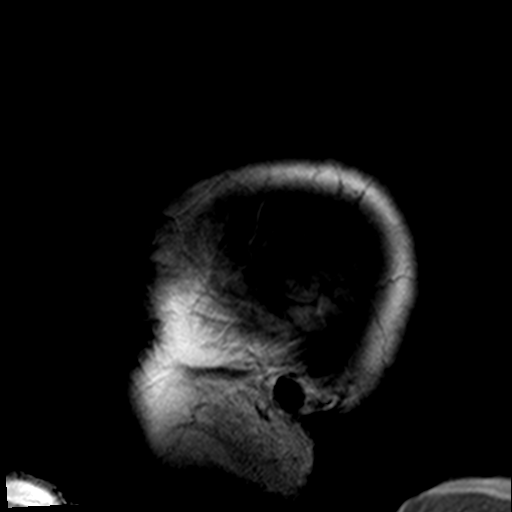

[Series 6: T2 · axial · 5.0mm · 0.75mm/px · z∈[-128,+10]mm · 3 of 23 slices shown (1 of 3)]
[im 1/23]
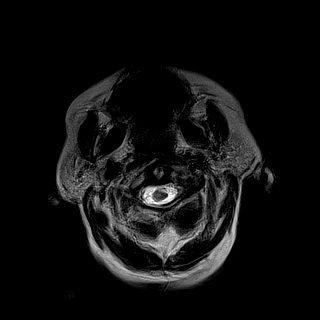
[im 12/23]
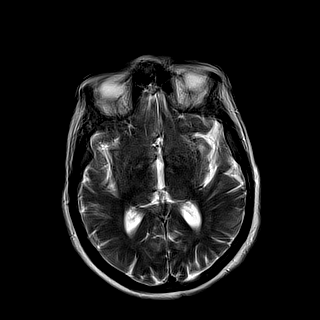
[im 23/23]
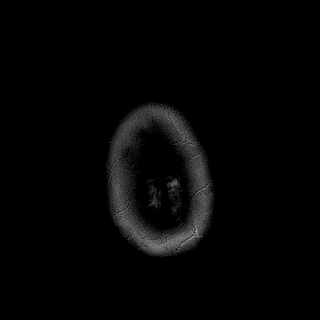

[Series 7: FLAIR · axial · 5.0mm · 0.94mm/px · z∈[-128,+10]mm · 3 of 23 slices shown]
[im 1/23]
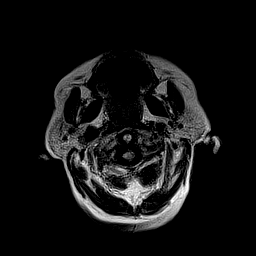
[im 12/23]
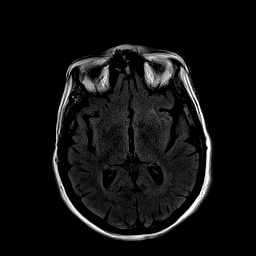
[im 23/23]
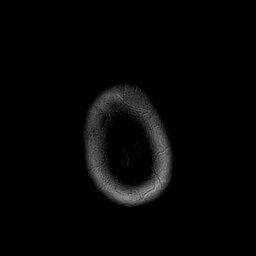

[Series 8: T1 · axial · 2.0mm · 0.42mm/px · z∈[-146,+35]mm · 8 of 95 slices shown]
[im 1/95]
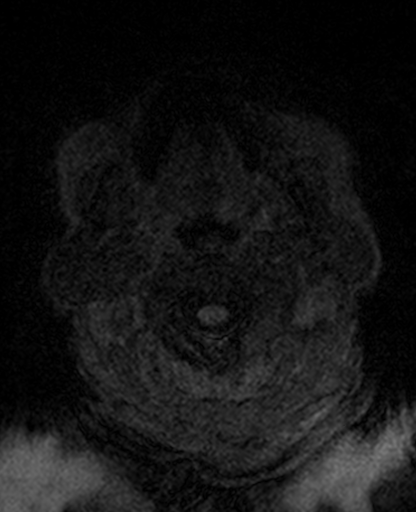
[im 19/95]
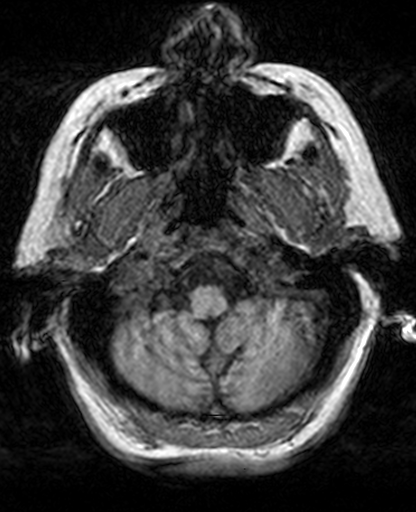
[im 29/95]
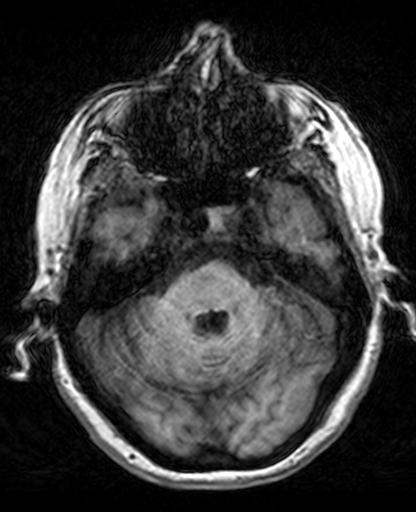
[im 38/95]
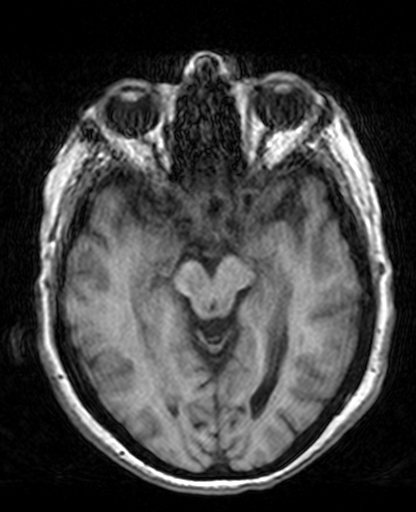
[im 57/95]
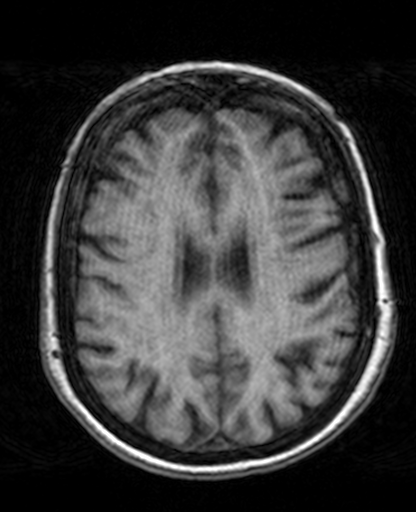
[im 66/95]
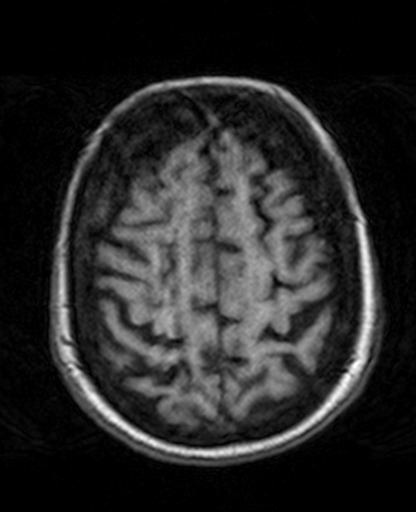
[im 76/95]
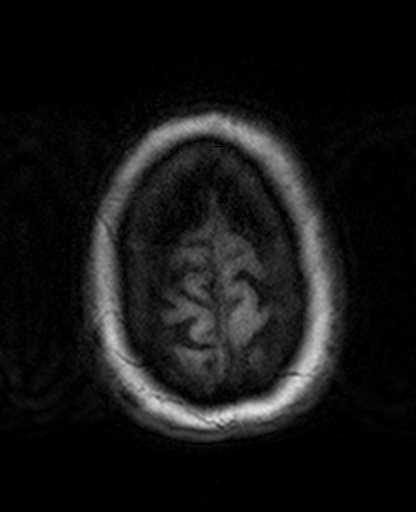
[im 95/95]
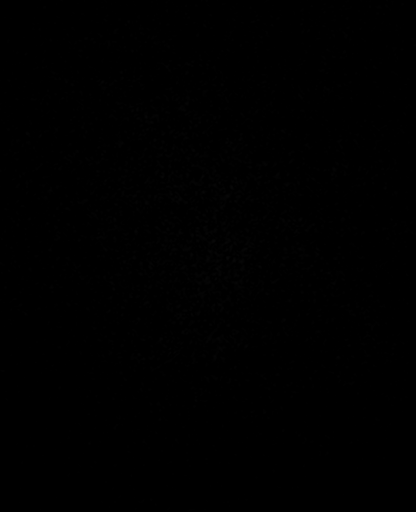

[Series 9: trauma axial · axial · 5.0mm · 0.45mm/px · z∈[-123,+2]mm · 2 of 21 slices shown]
[im 1/21]
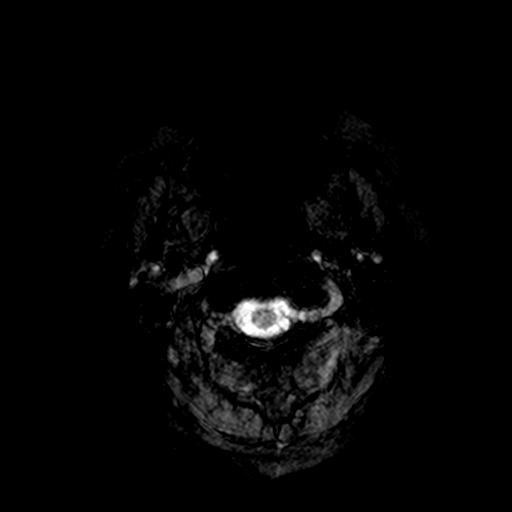
[im 21/21]
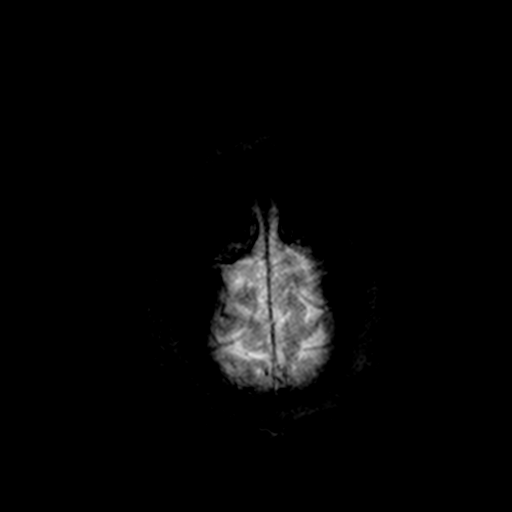

[Series 10: T2 · coronal · 5.0mm · 0.75mm/px · 3 of 28 slices shown (2 of 3)]
[im 1/28]
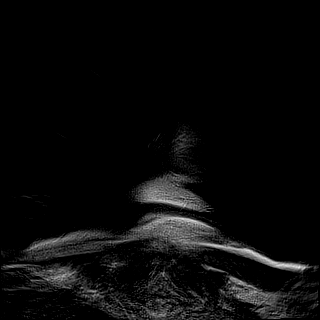
[im 14/28]
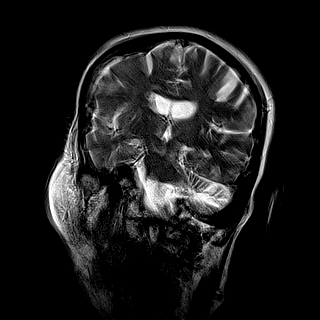
[im 28/28]
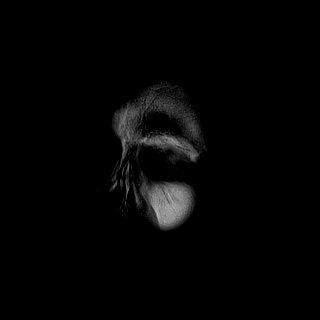

[Series 12: T2 · coronal · 5.0mm · 0.76mm/px · 3 of 24 slices shown (3 of 3)]
[im 1/24]
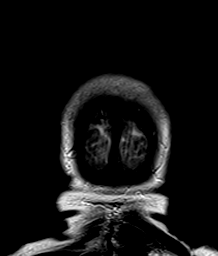
[im 12/24]
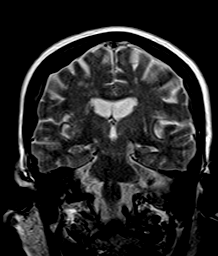
[im 24/24]
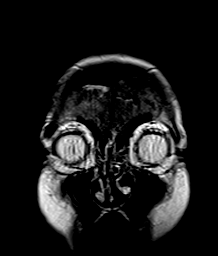

[Series 102: <mpr thick range(2)> · axial · 3.0mm · 0.73mm/px · z∈[-130,+0]mm · 5 of 46 slices shown]
[im 1/46]
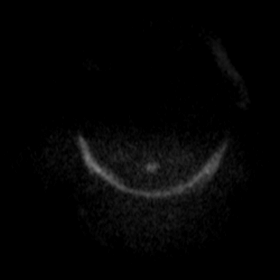
[im 12/46]
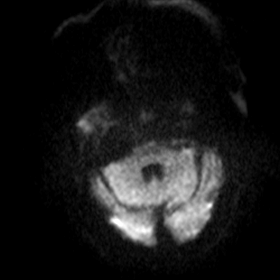
[im 23/46]
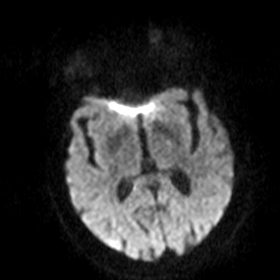
[im 34/46]
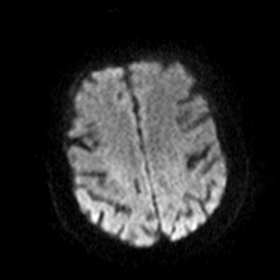
[im 46/46]
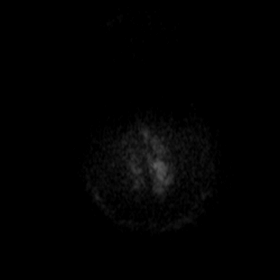

[Series 103: <mpr thick range(3)> · coronal · 3.0mm · 0.82mm/px · 6 of 50 slices shown]
[im 1/50]
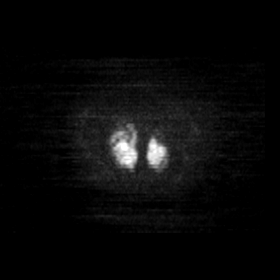
[im 10/50]
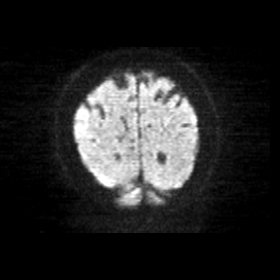
[im 20/50]
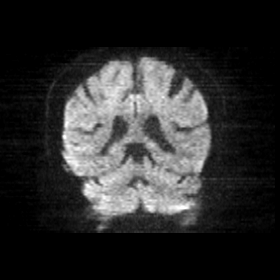
[im 30/50]
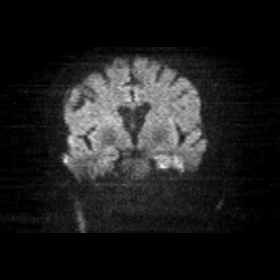
[im 40/50]
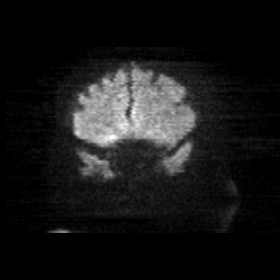
[im 50/50]
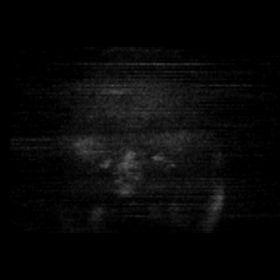

[Series 104: <mpr thick range(4)> · axial · 3.0mm · 0.73mm/px · z∈[-130,+0]mm · 5 of 46 slices shown]
[im 1/46]
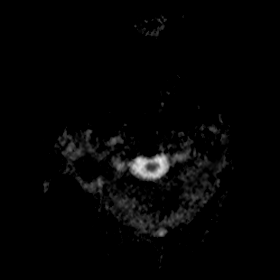
[im 12/46]
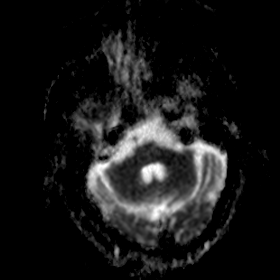
[im 23/46]
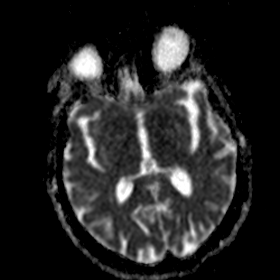
[im 34/46]
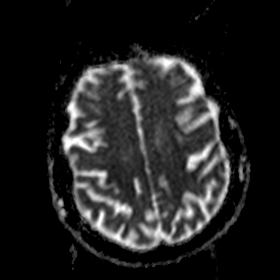
[im 46/46]
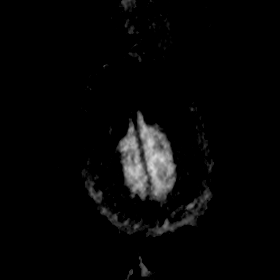

[Series 105: <mpr thick range(5)> · coronal · 3.0mm · 0.82mm/px · 3 of 50 slices shown]
[im 1/50]
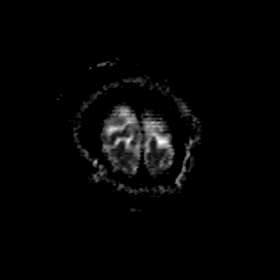
[im 10/50]
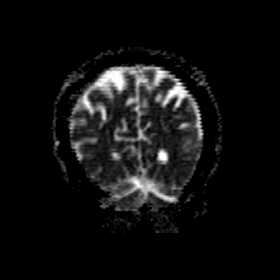
[im 20/50]
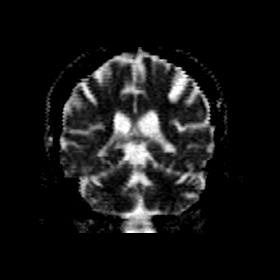

[42 of 48 positions shown; findings below may reference images not displayed]

FINDINGS: Today study is more degraded by motion than that 4 days ago. Stable
cerebral volume. Major intracranial vascular flow voids appear
stable, with a degree of intracranial artery dolichoectasia.

No restricted diffusion or evidence of acute infarction.

No midline shift, mass effect, or evidence of intracranial mass
lesion. No ventriculomegaly. No acute intracranial hemorrhage
identified. Gray and white matter signal appears stable allowing for
motion artifact today.

Stable paranasal sinuses and mastoids. Grossly stable orbits soft
tissues. Grossly stable visualized cervical spine.
IMPRESSION: No acute intracranial abnormality identified.

Allowing for motion artifact today, stable noncontrast MRI
appearance the brain since 01/26/2015.

## 2016-06-10 IMAGING — CR DG CHEST 1V PORT
1 series · 1 of 1 positions shown · non-contrast
Comparison: January 30, 2015

CLINICAL DATA: Dizziness and syncope.  Chronic shortness of breath

EXAM:
PORTABLE CHEST - 1 VIEW

[portable]
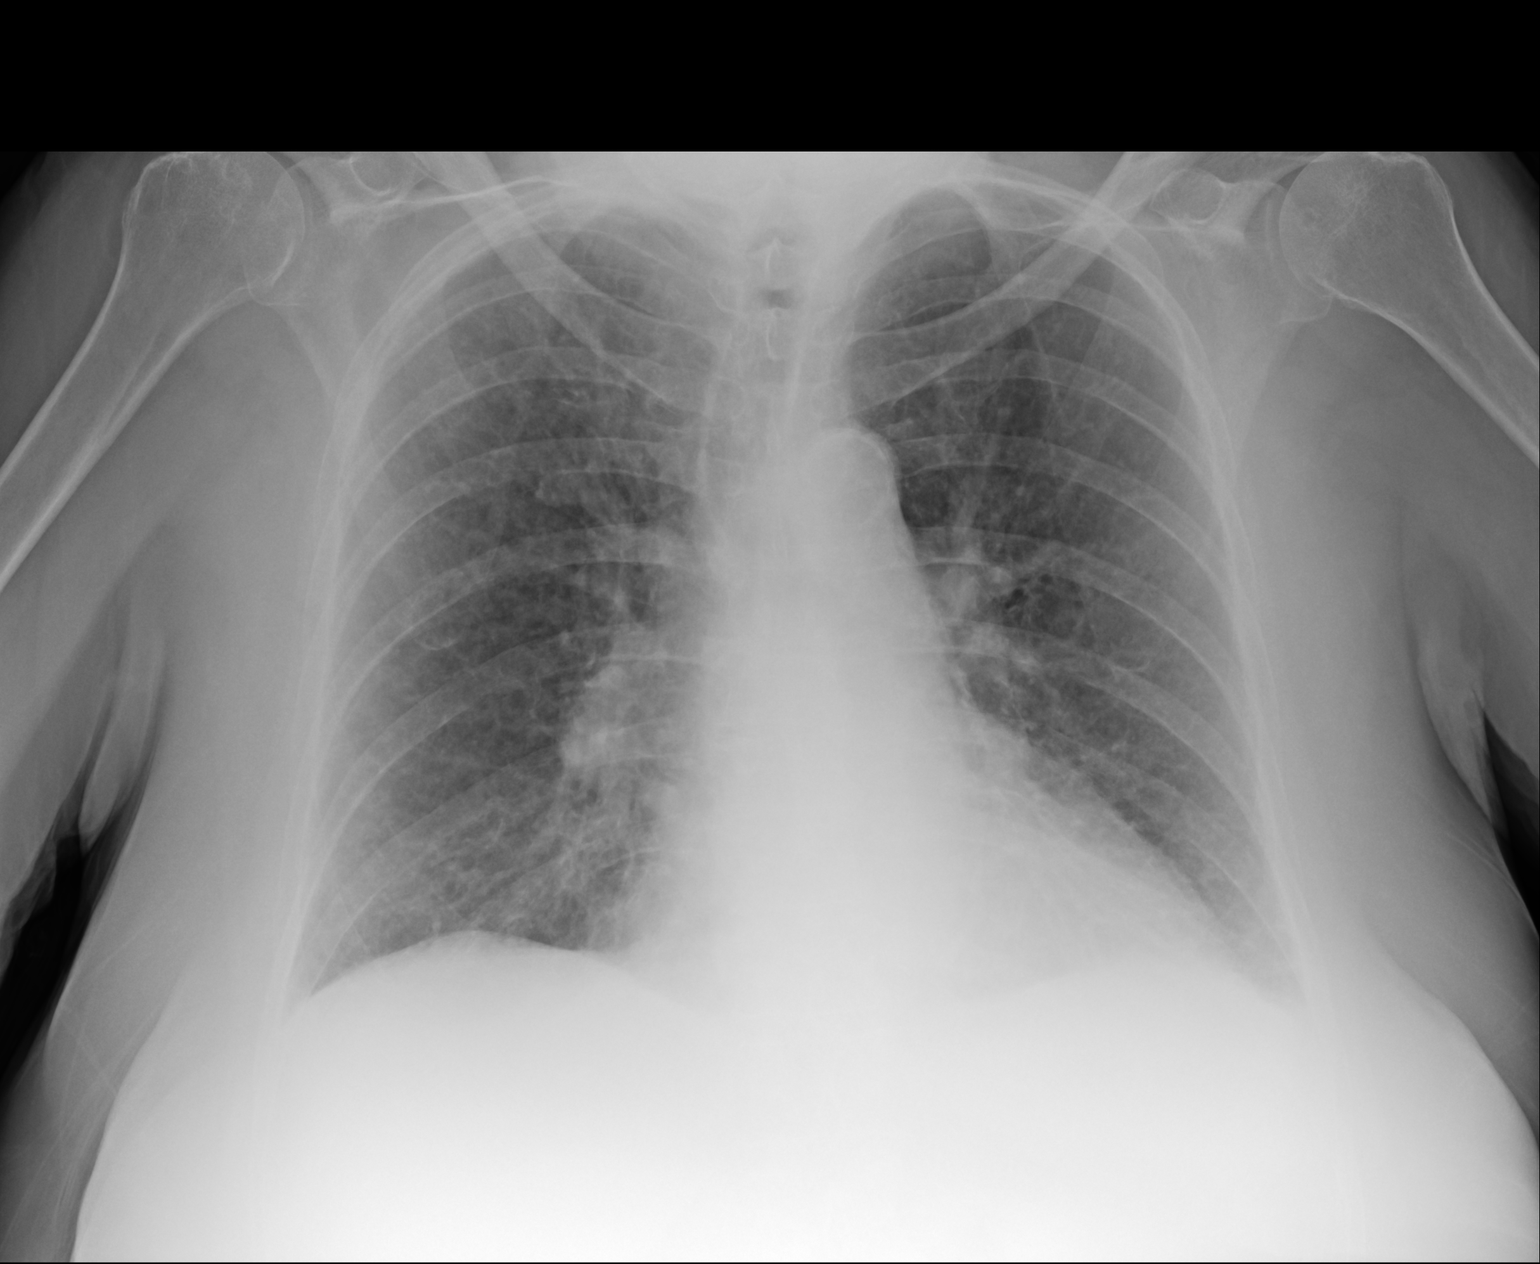

[1 of 1 positions shown; findings below may reference images not displayed]

FINDINGS: There is no edema or consolidation. Heart is upper normal in size
with pulmonary vascularity within normal limits. No adenopathy. No
bone lesions.
IMPRESSION: No edema or consolidation.
